# Patient Record
Sex: Male | Born: 1960 | Race: Black or African American | Hispanic: No | Marital: Married | Smoking: Never smoker
Health system: Southern US, Community
[De-identification: ages and names within clinical notes are randomized; demographics above are authoritative.]

## PROBLEM LIST (undated history)

## (undated) DIAGNOSIS — I10 Essential (primary) hypertension: Secondary | ICD-10-CM

## (undated) DIAGNOSIS — C801 Malignant (primary) neoplasm, unspecified: Secondary | ICD-10-CM

## (undated) DIAGNOSIS — K279 Peptic ulcer, site unspecified, unspecified as acute or chronic, without hemorrhage or perforation: Secondary | ICD-10-CM

## (undated) HISTORY — PX: HERNIA REPAIR: SHX51

## (undated) HISTORY — PX: VASECTOMY: SHX75

---

## 2016-10-17 ENCOUNTER — Other Ambulatory Visit (HOSPITAL_COMMUNITY): Payer: Self-pay | Admitting: Internal Medicine

## 2016-10-17 DIAGNOSIS — R748 Abnormal levels of other serum enzymes: Secondary | ICD-10-CM

## 2016-10-21 ENCOUNTER — Ambulatory Visit (HOSPITAL_COMMUNITY)
Admission: RE | Admit: 2016-10-21 | Discharge: 2016-10-21 | Disposition: A | Discharge status: Home / Self Care | Payer: BLUE CROSS/BLUE SHIELD | Source: Ambulatory Visit | Attending: Internal Medicine | Admitting: Internal Medicine

## 2016-10-21 DIAGNOSIS — R748 Abnormal levels of other serum enzymes: Secondary | ICD-10-CM

## 2016-10-21 DIAGNOSIS — N281 Cyst of kidney, acquired: Secondary | ICD-10-CM | POA: Insufficient documentation

## 2016-10-26 ENCOUNTER — Other Ambulatory Visit (HOSPITAL_COMMUNITY): Payer: Self-pay | Admitting: Internal Medicine

## 2016-10-26 DIAGNOSIS — R160 Hepatomegaly, not elsewhere classified: Secondary | ICD-10-CM

## 2016-10-27 HISTORY — PX: PERCUTANEOUS LIVER BIOPSY: SUR136

## 2016-10-28 ENCOUNTER — Other Ambulatory Visit (HOSPITAL_COMMUNITY): Payer: Self-pay | Admitting: Internal Medicine

## 2016-10-28 DIAGNOSIS — R160 Hepatomegaly, not elsewhere classified: Secondary | ICD-10-CM

## 2016-11-02 ENCOUNTER — Ambulatory Visit (HOSPITAL_COMMUNITY)
Admission: RE | Admit: 2016-11-02 | Discharge: 2016-11-02 | Disposition: A | Discharge status: Home / Self Care | Payer: BLUE CROSS/BLUE SHIELD | Source: Ambulatory Visit | Attending: Internal Medicine | Admitting: Internal Medicine

## 2016-11-02 ENCOUNTER — Encounter (HOSPITAL_COMMUNITY): Payer: Self-pay

## 2016-11-02 DIAGNOSIS — R160 Hepatomegaly, not elsewhere classified: Secondary | ICD-10-CM | POA: Insufficient documentation

## 2016-11-02 DIAGNOSIS — C787 Secondary malignant neoplasm of liver and intrahepatic bile duct: Secondary | ICD-10-CM | POA: Insufficient documentation

## 2016-11-02 DIAGNOSIS — K8689 Other specified diseases of pancreas: Secondary | ICD-10-CM | POA: Insufficient documentation

## 2016-11-02 MED ORDER — IOPAMIDOL (ISOVUE-300) INJECTION 61%
INTRAVENOUS | Status: AC
  Administered 2016-11-02: 100 mL via INTRAVENOUS
  Filled 2016-11-02: qty 100

## 2016-11-02 MED ORDER — SODIUM CHLORIDE 0.9 % IJ SOLN
INTRAMUSCULAR | Status: AC
Start: 2016-11-02 — End: 2016-11-03
  Filled 2016-11-02: qty 50

## 2016-11-08 ENCOUNTER — Other Ambulatory Visit: Payer: Self-pay | Admitting: Student

## 2016-11-09 ENCOUNTER — Other Ambulatory Visit: Payer: Self-pay | Admitting: Radiology

## 2016-11-10 ENCOUNTER — Encounter (HOSPITAL_COMMUNITY): Payer: Self-pay

## 2016-11-10 ENCOUNTER — Ambulatory Visit (HOSPITAL_COMMUNITY)
Admission: RE | Admit: 2016-11-10 | Discharge: 2016-11-10 | Disposition: A | Discharge status: Home / Self Care | Payer: BLUE CROSS/BLUE SHIELD | Source: Ambulatory Visit | Attending: Internal Medicine | Admitting: Internal Medicine

## 2016-11-10 DIAGNOSIS — R160 Hepatomegaly, not elsewhere classified: Secondary | ICD-10-CM | POA: Insufficient documentation

## 2016-11-10 DIAGNOSIS — C259 Malignant neoplasm of pancreas, unspecified: Secondary | ICD-10-CM | POA: Insufficient documentation

## 2016-11-10 DIAGNOSIS — C787 Secondary malignant neoplasm of liver and intrahepatic bile duct: Secondary | ICD-10-CM | POA: Insufficient documentation

## 2016-11-10 DIAGNOSIS — I10 Essential (primary) hypertension: Secondary | ICD-10-CM | POA: Diagnosis not present

## 2016-11-10 HISTORY — DX: Essential (primary) hypertension: I10

## 2016-11-10 LAB — CBC
HCT: 32.1 % — ABNORMAL LOW (ref 39.0–52.0)
Hemoglobin: 10.8 g/dL — ABNORMAL LOW (ref 13.0–17.0)
MCH: 25.8 pg — AB (ref 26.0–34.0)
MCHC: 33.6 g/dL (ref 30.0–36.0)
MCV: 76.6 fL — AB (ref 78.0–100.0)
PLATELETS: 484 10*3/uL — AB (ref 150–400)
RBC: 4.19 MIL/uL — ABNORMAL LOW (ref 4.22–5.81)
RDW: 16.8 % — AB (ref 11.5–15.5)
WBC: 12.8 10*3/uL — AB (ref 4.0–10.5)

## 2016-11-10 LAB — COMPREHENSIVE METABOLIC PANEL
ALK PHOS: 974 U/L — AB (ref 38–126)
ALT: 176 U/L — ABNORMAL HIGH (ref 17–63)
ANION GAP: 8 (ref 5–15)
AST: 210 U/L — ABNORMAL HIGH (ref 15–41)
Albumin: 2.7 g/dL — ABNORMAL LOW (ref 3.5–5.0)
BILIRUBIN TOTAL: 8.3 mg/dL — AB (ref 0.3–1.2)
BUN: 12 mg/dL (ref 6–20)
CALCIUM: 9.4 mg/dL (ref 8.9–10.3)
CO2: 25 mmol/L (ref 22–32)
Chloride: 104 mmol/L (ref 101–111)
Creatinine, Ser: 1.34 mg/dL — ABNORMAL HIGH (ref 0.61–1.24)
GFR calc non Af Amer: 59 mL/min — ABNORMAL LOW (ref >60)
Glucose, Bld: 93 mg/dL (ref 65–99)
Potassium: 4.2 mmol/L (ref 3.5–5.1)
Sodium: 137 mmol/L (ref 135–145)
TOTAL PROTEIN: 7.9 g/dL (ref 6.5–8.1)

## 2016-11-10 LAB — PROTIME-INR
INR: 1.24
INR: 1.22
PROTHROMBIN TIME: 15.6 s — AB (ref 11.4–15.2)
Prothrombin Time: 15.5 seconds — ABNORMAL HIGH (ref 11.4–15.2)

## 2016-11-10 LAB — APTT
APTT: 34 s (ref 24–36)
aPTT: 34 seconds (ref 24–36)

## 2016-11-10 MED ORDER — FENTANYL CITRATE (PF) 100 MCG/2ML IJ SOLN
INTRAMUSCULAR | Status: AC
  Filled 2016-11-10: qty 4

## 2016-11-10 MED ORDER — MIDAZOLAM HCL 2 MG/2ML IJ SOLN
INTRAMUSCULAR | Status: AC
  Filled 2016-11-10: qty 6

## 2016-11-10 MED ORDER — MIDAZOLAM HCL 2 MG/2ML IJ SOLN
INTRAMUSCULAR | Status: AC | PRN
  Administered 2016-11-10: 1 mg via INTRAVENOUS
  Administered 2016-11-10: 2 mg via INTRAVENOUS
  Administered 2016-11-10: 1 mg via INTRAVENOUS

## 2016-11-10 MED ORDER — HYDROCODONE-ACETAMINOPHEN 5-325 MG PO TABS
1.0000 | ORAL_TABLET | ORAL | Status: DC | PRN
Start: 2016-11-10 — End: 2016-11-11

## 2016-11-10 MED ORDER — FENTANYL CITRATE (PF) 100 MCG/2ML IJ SOLN
INTRAMUSCULAR | Status: AC | PRN
  Administered 2016-11-10: 25 ug via INTRAVENOUS
  Administered 2016-11-10: 50 ug via INTRAVENOUS
  Administered 2016-11-10: 25 ug via INTRAVENOUS

## 2016-11-10 MED ORDER — SODIUM CHLORIDE 0.9 % IV SOLN
INTRAVENOUS | Status: DC
  Administered 2016-11-10: 11:00:00 via INTRAVENOUS

## 2016-11-10 NOTE — Procedures (Signed)
US liver lesion core biopsy 18g x3 to surg path No complication No blood loss. See complete dictation in [facility].

## 2016-11-10 NOTE — H&P (Signed)
Chief Complaint: hepatic lesions  Referring Physician:Dr. Beatriz Chancellor [facility])  Supervising Physician: Arne Cleveland  Patient Status: [facility] - Out-pt  HPI: Henry Barton is an 56 y.o. male who has otherwise been healthy until this holiday season he noted a 20lb weight loss that was unintentional.  He lives in [place], [place], but is down here in [place] for 2 years with his job at [employer].  He is an Chief Financial Officer.  His PCP ordered some lab work and noted his LFTs to be significantly elevated.  An [place] was obtained that revealed liver masses and then a CT scan was ordered which revealed: 1. 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 2. Hypoenhancing, potentially necrotic pancreatic head mass measuring up to 4.0 cm and extending in exophytic fashion into the peripancreatic fat inferior to the head of pancreas. A second lesion identified near the body tail junction of pancreas. 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 3. Borderline enlarged left para-aortic lymph nodes suspicious for metastatic involvement.  He has been referred for a liver lesion biopsy to confirm the suspected diagnosis.  He has stated that his eyes have become jaundice over the last week.  He denies nausea or vomiting.  He has occasional intermittent abdominal discomfort.  Past Medical History:  Past Medical History:  Diagnosis Date  . Hypertension     Past Surgical History:  Past Surgical History:  Procedure Laterality Date  . HERNIA REPAIR     age 9  . VASECTOMY      Family History: History reviewed. No pertinent family history.  Social History:  reports that he has never smoked. He  does not have any smokeless tobacco history on file. His alcohol and drug histories are not on file.  Allergies: No Known Allergies  Medications: Medications reviewed in epic  Please HPI for pertinent positives, otherwise complete 10 system ROS negative.  Mallampati Score: MD Evaluation Airway: WNL Heart: WNL Abdomen: Other (comments) Abdomen comments: liver enlargement Chest/ Lungs: WNL ASA  Classification: 2 Mallampati/Airway Score: Two  Physical Exam: BP (!) 148/97 (BP Location: Right Arm)   Pulse 98   Temp 98.6 F (37 C) (Oral)   Resp 18   Ht 5\' 10"  (1.778 m)   Wt 172 lb 6.4 oz (78.2 kg) Comment: per patient this am   SpO2 100%   BMI 24.74 kg/m  Body mass index is 24.74 kg/m. General: pleasant, WD, WN black male who is laying in bed in NAD HEENT: head is normocephalic, atraumatic.  Sclera are icteric.  PERRL.  Ears and nose without any masses or lesions.  Mouth is pink and moist Heart: regular, rate, and rhythm.  Normal s1,s2. No obvious murmurs, gallops, or rubs noted.  Palpable radial and pedal pulses bilaterally Lungs: CTAB, no wheezes, rhonchi, or rales noted.  Respiratory effort nonlabored Abd: soft, NT, ND, +BS, no masses, hernias.  +hepatomegaly MS: all 4 extremities are symmetrical with no cyanosis, clubbing, or edema. Psych: A&Ox3 with an appropriate affect.   Labs: Results for orders placed or performed during the hospital encounter of 11/10/16 (from the past 48 hour(s))  APTT upon arrival     Status: None   Collection Time: 11/10/16 11:18 AM  Result Value Ref  Range   aPTT 34 24 - 36 seconds  CBC upon arrival     Status: Abnormal   Collection Time: 11/10/16 11:18 AM  Result Value Ref Range   WBC 12.8 (H) 4.0 - 10.5 K/uL   RBC 4.19 (L) 4.22 - 5.81 MIL/uL   Hemoglobin 10.8 (L) 13.0 - 17.0 g/dL   HCT 32.1 (L) 39.0 - 52.0 %   MCV 76.6 (L) 78.0 - 100.0 fL   MCH 25.8 (L) 26.0 - 34.0 pg   MCHC 33.6 30.0 - 36.0 g/dL   RDW 16.8 (H) 11.5 - 15.5 %    Platelets 484 (H) 150 - 400 K/uL  Protime-INR upon arrival     Status: Abnormal   Collection Time: 11/10/16 11:18 AM  Result Value Ref Range   Prothrombin Time 15.6 (H) 11.4 - 15.2 seconds   INR 1.24     Imaging: No results found.  Assessment/Plan 1. Pancreatic mass with multiple liver lesions -we will plan to proceed today with a liver lesion biopsy -he has also noted his eyes have become increasing jaundice over the last week.  We will check a CMET to verfiy that his TB or any other labs are not dangerously high or concerning. -vitals reviewed -Risks and Benefits discussed with the patient including, but not limited to bleeding, infection, damage to adjacent structures or low yield requiring additional tests. All of the patient's questions were answered, patient is agreeable to proceed. Consent signed and in chart.  Thank you for this interesting consult.  I greatly enjoyed meeting Henry Barton and look forward to participating in their care.  A copy of this report was sent to the requesting provider on this date.  Electronically Signed: Henreitta Cea 11/10/2016, 12:14 PM   I spent a total of  30 Minutes   in face to face in clinical consultation, greater than 50% of which was counseling/coordinating care for liver lesions

## 2016-11-10 NOTE — Discharge Instructions (Signed)
Liver Biopsy °The liver is a large organ in the upper right-hand side of your abdomen. A liver biopsy is a procedure in which a tissue sample is taken from the liver and examined under a microscope. The procedure is done to confirm a suspected problem. °There are three types of liver biopsies: °· Percutaneous. In this type, an incision is made in your abdomen. The sample is removed through the incision with a needle. °· Laparoscopic. In this type, several incisions are made in the abdomen. A tiny camera is passed through one of the incisions to help guide the health care provider. The sample is removed through the other incision or incisions. °· Transjugular. In this type, an incision is made in the neck. A tube is passed through the incision to the liver. The sample is removed through the tube with a needle. °Tell a health care provider about: °· Any allergies you have. °· All medicines you are taking, including vitamins, herbs, eye drops, creams, and over-the-counter medicines. °· Any problems you or family members have had with anesthetic medicines. °· Any blood disorders you have. °· Any surgeries you have had. °· Any medical conditions you have. °· Possibility of pregnancy, if this applies. °What are the risks? °Generally, this is a safe procedure. However, problems can occur and include: °· Bleeding. °· Infection. °· Bruising. °· Collapsed lung. °· Leak of digestive juices (bile) from the liver or gallbladder. °· Problems with heart rhythm. °· Pain at the biopsy site or in the right shoulder. °· Low blood pressure (hypotension). °· Injury to nearby organs or tissues. °What happens before the procedure? °· Your health care provider may do some blood or urine tests. These will help your health care provider learn how well your kidneys and liver are working and how well your blood clots. °· Ask your health care provider if you will be able to go home the day of the procedure. Arrange for someone to take you home  and stay with you for at least 24 hours. °· Do not eat or drink anything after midnight on the night before the procedure or as directed by your health care provider. °· Ask your health care provider about: °¨ Changing or stopping your regular medicines. This is especially important if you are taking diabetes medicines or blood thinners. °¨ Taking medicines such as aspirin and ibuprofen. These medicines can thin your blood. Do not take these medicines before your procedure if your health care provider asks you not to. °What happens during the procedure? °Regardless of the type of biopsy that will be done, you will have an IV line placed. Through this line, you will receive fluids and medicine to relax you. If you will be having a laparoscopic biopsy, you may also receive medicine through this line to make you sleep during the procedure (general anesthetic). °Percutaneous Liver Biopsy °· You will positioned on your back, with your right hand over your head. °· A health care provider will locate your liver by tapping and pressing on the right side of your abdomen or with the help of an ultrasound machine or CT scan. °· An area at the bottom of your last right rib will be numbed. °· An incision will be made in the numbed area. °· The biopsy needle will be inserted into the incision. °· Several samples of liver tissue will be taken with the biopsy needle. You will be asked to hold your breath as each sample is taken. °Laparoscopic Liver Biopsy °·   You will be positioned on your back.  Several small incisions will be made in your abdomen.  Your doctor will pass a tiny camera through one incision. The camera will allow the liver to be viewed on a TV monitor in the operating room.  Tools will be passed through the other incision or incisions. These tools will be used to remove samples of liver tissue. Transjugular Liver Biopsy  You will be positioned on your back on an X-ray table, with your head turned to your  left.  An area on your neck just over your jugular vein will be numbed.  An incision will be made in the numbed area.  A tiny tube will be inserted through the incision. It will be pushed through the jugular vein to a blood vessel in the liver called the hepatic vein.  Dye will be inserted through the tube, and X-rays will be taken. The dye will make the blood vessels in the liver light up on the X-rays.  The biopsy needle will be pushed through the tube until it reaches the liver.  Samples of liver tissue will be taken with the biopsy needle.  The needle and the tube will be removed. After the samples are obtained, the incision or incisions will be closed. What happens after the procedure?  You will be taken to a recovery area.  You may have to lie on your right side for 1-2 hours. This will prevent bleeding from the biopsy site.  Your progress will be watched. Your blood pressure, pulse, and the biopsy site will be checked often.  You may have some pain or feel sick. If this happens, tell your health care provider.  As you begin to feel better, you will be offered ice and beverages.  You may be allowed to go home when the medicines have worn off and you can walk, drink, eat, and use the bathroom. This information is not intended to replace advice given to you by your health care provider. Make sure you discuss any questions you have with your health care provider. Document Released: 12/03/2003 Document Revised: 02/15/2016 Document Reviewed: 11/08/2013 Elsevier Interactive Patient Education  2017 [employer].  Liver Biopsy, Care After Introduction Refer to this sheet in the next few weeks. These instructions provide you with information on caring for yourself after your procedure. Your health care provider may also give you more specific instructions. Your treatment has been planned according to current medical practices, but problems sometimes occur. Call your health care  provider if you have any problems or questions after your procedure. What can I expect after the procedure? After your procedure, it is typical to have the following:  A small amount of discomfort in the area where the biopsy was done and in the right shoulder or shoulder blade.  A small amount of bruising around the area where the biopsy was done and on the skin over the liver.  Sleepiness and fatigue for the rest of the day. Follow these instructions at home:  Rest at home for 1-2 days or as directed by your health care provider.  Have a friend or family member stay with you for at least 24 hours.  Because of the medicines used during the procedure, you should not do the following things in the first 24 hours:  Drive.  Use machinery.  Be responsible for the care of other people.  Sign legal documents.  Take a bath or shower.  There are many different ways to close  and cover an incision, including stitches, skin glue, and adhesive strips. Follow your health care provider's instructions on:  Incision care.  Bandage (dressing) changes and removal.  Incision closure removal.  Do not drink alcohol in the first week.  Do not lift more than 5 pounds or play contact sports for 2 weeks after this test.  Take medicines only as directed by your health care provider. Do not take medicine containing aspirin or non-steroidal anti-inflammatory medicines such as ibuprofen for 1 week after this test.  It is your responsibility to get your test results. Contact a health care provider if:  You have increased bleeding from an incision that results in more than a small spot of blood.  You have redness, swelling, or increasing pain in any incisions.  You notice a discharge or a bad smell coming from any of your incisions.  You have a fever or chills. Get help right away if:  You develop swelling, bloating, or pain in your abdomen.  You become dizzy or faint.  You develop a  rash.  You are nauseous or vomit.  You have difficulty breathing, feel short of breath, or feel faint.  You develop chest pain.  You have problems with your speech or vision.  You have trouble balancing or moving your arms or legs. This information is not intended to replace advice given to you by your health care provider. Make sure you discuss any questions you have with your health care provider. Document Released: 04/01/2005 Document Revised: 02/18/2016 Document Reviewed: 11/08/2013  2017 Elsevier  Moderate Conscious Sedation, Adult, Care After These instructions provide you with information about caring for yourself after your procedure. Your health care provider may also give you more specific instructions. Your treatment has been planned according to current medical practices, but problems sometimes occur. Call your health care provider if you have any problems or questions after your procedure. What can I expect after the procedure? After your procedure, it is common:  To feel sleepy for several hours.  To feel clumsy and have poor balance for several hours.  To have poor judgment for several hours.  To vomit if you eat too soon. Follow these instructions at home: For at least 24 hours after the procedure:   Do not:  Participate in activities where you could fall or become injured.  Drive.  Use heavy machinery.  Drink alcohol.  Take sleeping pills or medicines that cause drowsiness.  Make important decisions or sign legal documents.  Take care of children on your own.  Rest. Eating and drinking  Follow the diet recommended by your health care provider.  If you vomit:  Drink water, juice, or soup when you can drink without vomiting.  Make sure you have little or no nausea before eating solid foods. General instructions  Have a responsible adult stay with you until you are awake and alert.  Take over-the-counter and prescription medicines only as  told by your health care provider.  If you smoke, do not smoke without supervision.  Keep all follow-up visits as told by your health care provider. This is important. Contact a health care provider if:  You keep feeling nauseous or you keep vomiting.  You feel light-headed.  You develop a rash.  You have a fever. Get help right away if:  You have trouble breathing. This information is not intended to replace advice given to you by your health care provider. Make sure you discuss any questions you have with your health  care provider.   Access to my chart Scott:  [email or web address] Bottom right corner of web site allows new users to sign up Document Released: 07/03/2013 Document Revised: 02/15/2016 Document Reviewed: 01/02/2016 Elsevier Interactive Patient Education  2017 [employer].

## 2016-12-27 ENCOUNTER — Inpatient Hospital Stay (HOSPITAL_COMMUNITY)
Admission: EM | Admit: 2016-12-27 | Discharge: 2017-01-15 | DRG: 377 | Disposition: A | Discharge status: Hospice | Payer: BLUE CROSS/BLUE SHIELD | Attending: Internal Medicine | Admitting: Internal Medicine

## 2016-12-27 ENCOUNTER — Encounter (HOSPITAL_COMMUNITY): Payer: Self-pay

## 2016-12-27 DIAGNOSIS — Z452 Encounter for adjustment and management of vascular access device: Secondary | ICD-10-CM

## 2016-12-27 DIAGNOSIS — E162 Hypoglycemia, unspecified: Secondary | ICD-10-CM | POA: Diagnosis present

## 2016-12-27 DIAGNOSIS — E875 Hyperkalemia: Secondary | ICD-10-CM

## 2016-12-27 DIAGNOSIS — D62 Acute posthemorrhagic anemia: Secondary | ICD-10-CM | POA: Diagnosis present

## 2016-12-27 DIAGNOSIS — Z515 Encounter for palliative care: Secondary | ICD-10-CM

## 2016-12-27 DIAGNOSIS — K921 Melena: Secondary | ICD-10-CM | POA: Diagnosis not present

## 2016-12-27 DIAGNOSIS — I120 Hypertensive chronic kidney disease with stage 5 chronic kidney disease or end stage renal disease: Secondary | ICD-10-CM | POA: Diagnosis present

## 2016-12-27 DIAGNOSIS — K922 Gastrointestinal hemorrhage, unspecified: Secondary | ICD-10-CM | POA: Diagnosis present

## 2016-12-27 DIAGNOSIS — Y841 Kidney dialysis as the cause of abnormal reaction of the patient, or of later complication, without mention of misadventure at the time of the procedure: Secondary | ICD-10-CM | POA: Diagnosis not present

## 2016-12-27 DIAGNOSIS — R188 Other ascites: Secondary | ICD-10-CM

## 2016-12-27 DIAGNOSIS — K274 Chronic or unspecified peptic ulcer, site unspecified, with hemorrhage: Secondary | ICD-10-CM

## 2016-12-27 DIAGNOSIS — Z66 Do not resuscitate: Secondary | ICD-10-CM

## 2016-12-27 DIAGNOSIS — T82838A Hemorrhage of vascular prosthetic devices, implants and grafts, initial encounter: Secondary | ICD-10-CM | POA: Diagnosis not present

## 2016-12-27 DIAGNOSIS — M7989 Other specified soft tissue disorders: Secondary | ICD-10-CM

## 2016-12-27 DIAGNOSIS — E872 Acidosis: Secondary | ICD-10-CM

## 2016-12-27 DIAGNOSIS — Z6829 Body mass index (BMI) 29.0-29.9, adult: Secondary | ICD-10-CM

## 2016-12-27 DIAGNOSIS — C259 Malignant neoplasm of pancreas, unspecified: Secondary | ICD-10-CM

## 2016-12-27 DIAGNOSIS — E861 Hypovolemia: Secondary | ICD-10-CM | POA: Diagnosis present

## 2016-12-27 DIAGNOSIS — N170 Acute kidney failure with tubular necrosis: Secondary | ICD-10-CM | POA: Diagnosis present

## 2016-12-27 DIAGNOSIS — N186 End stage renal disease: Secondary | ICD-10-CM

## 2016-12-27 DIAGNOSIS — R066 Hiccough: Secondary | ICD-10-CM | POA: Diagnosis not present

## 2016-12-27 DIAGNOSIS — G893 Neoplasm related pain (acute) (chronic): Secondary | ICD-10-CM | POA: Diagnosis present

## 2016-12-27 DIAGNOSIS — R578 Other shock: Secondary | ICD-10-CM | POA: Diagnosis present

## 2016-12-27 DIAGNOSIS — J9811 Atelectasis: Secondary | ICD-10-CM | POA: Diagnosis present

## 2016-12-27 DIAGNOSIS — Z7189 Other specified counseling: Secondary | ICD-10-CM

## 2016-12-27 DIAGNOSIS — R509 Fever, unspecified: Secondary | ICD-10-CM

## 2016-12-27 DIAGNOSIS — N179 Acute kidney failure, unspecified: Secondary | ICD-10-CM

## 2016-12-27 DIAGNOSIS — K279 Peptic ulcer, site unspecified, unspecified as acute or chronic, without hemorrhage or perforation: Secondary | ICD-10-CM | POA: Diagnosis present

## 2016-12-27 DIAGNOSIS — E43 Unspecified severe protein-calorie malnutrition: Secondary | ICD-10-CM

## 2016-12-27 DIAGNOSIS — D689 Coagulation defect, unspecified: Secondary | ICD-10-CM | POA: Diagnosis present

## 2016-12-27 DIAGNOSIS — K920 Hematemesis: Secondary | ICD-10-CM | POA: Diagnosis present

## 2016-12-27 DIAGNOSIS — E8770 Fluid overload, unspecified: Secondary | ICD-10-CM | POA: Diagnosis present

## 2016-12-27 DIAGNOSIS — F329 Major depressive disorder, single episode, unspecified: Secondary | ICD-10-CM | POA: Diagnosis present

## 2016-12-27 DIAGNOSIS — C787 Secondary malignant neoplasm of liver and intrahepatic bile duct: Secondary | ICD-10-CM | POA: Diagnosis present

## 2016-12-27 DIAGNOSIS — D72829 Elevated white blood cell count, unspecified: Secondary | ICD-10-CM | POA: Diagnosis present

## 2016-12-27 DIAGNOSIS — K7290 Hepatic failure, unspecified without coma: Secondary | ICD-10-CM | POA: Diagnosis present

## 2016-12-27 DIAGNOSIS — D696 Thrombocytopenia, unspecified: Secondary | ICD-10-CM | POA: Diagnosis not present

## 2016-12-27 HISTORY — DX: Peptic ulcer, site unspecified, unspecified as acute or chronic, without hemorrhage or perforation: K27.9

## 2016-12-27 HISTORY — DX: Malignant (primary) neoplasm, unspecified: C80.1

## 2016-12-27 LAB — CBC
HCT: 12 % — ABNORMAL LOW (ref 39.0–52.0)
Hemoglobin: 4.3 g/dL — CL (ref 13.0–17.0)
MCH: 29.9 pg (ref 26.0–34.0)
MCHC: 35.8 g/dL (ref 30.0–36.0)
MCV: 83.3 fL (ref 78.0–100.0)
PLATELETS: 280 10*3/uL (ref 150–400)
RBC: 1.44 MIL/uL — AB (ref 4.22–5.81)
RDW: 19.1 % — ABNORMAL HIGH (ref 11.5–15.5)
WBC: 26.5 10*3/uL — AB (ref 4.0–10.5)

## 2016-12-27 LAB — BASIC METABOLIC PANEL
ANION GAP: 26 — AB (ref 5–15)
ANION GAP: 23 — AB (ref 5–15)
BUN: 173 mg/dL — ABNORMAL HIGH (ref 6–20)
BUN: 175 mg/dL — ABNORMAL HIGH (ref 6–20)
CALCIUM: 7.8 mg/dL — AB (ref 8.9–10.3)
CHLORIDE: 95 mmol/L — AB (ref 101–111)
CO2: 8 mmol/L — ABNORMAL LOW (ref 22–32)
CO2: 10 mmol/L — ABNORMAL LOW (ref 22–32)
CREATININE: 10.75 mg/dL — AB (ref 0.61–1.24)
Calcium: 7.7 mg/dL — ABNORMAL LOW (ref 8.9–10.3)
Chloride: 92 mmol/L — ABNORMAL LOW (ref 101–111)
Creatinine, Ser: 10.69 mg/dL — ABNORMAL HIGH (ref 0.61–1.24)
GFR calc non Af Amer: 5 mL/min — ABNORMAL LOW (ref >60)
GFR, EST AFRICAN AMERICAN: 5 mL/min — AB (ref >60)
GFR, EST AFRICAN AMERICAN: 5 mL/min — AB (ref >60)
GFR, EST NON AFRICAN AMERICAN: 5 mL/min — AB (ref >60)
Glucose, Bld: 87 mg/dL (ref 65–99)
Glucose, Bld: 93 mg/dL (ref 65–99)
POTASSIUM: 7.2 mmol/L — AB (ref 3.5–5.1)
Potassium: >7.5 mmol/L (ref 3.5–5.1)
SODIUM: 126 mmol/L — AB (ref 135–145)
SODIUM: 128 mmol/L — AB (ref 135–145)

## 2016-12-27 LAB — HEPATIC FUNCTION PANEL
ALT: 131 U/L — AB (ref 17–63)
AST: 302 U/L — AB (ref 15–41)
Albumin: 1.3 g/dL — ABNORMAL LOW (ref 3.5–5.0)
Alkaline Phosphatase: 1285 U/L — ABNORMAL HIGH (ref 38–126)
BILIRUBIN DIRECT: 10 mg/dL — AB (ref 0.1–0.5)
Indirect Bilirubin: 6.9 mg/dL — ABNORMAL HIGH (ref 0.3–0.9)
Total Bilirubin: 16.9 mg/dL — ABNORMAL HIGH (ref 0.3–1.2)
Total Protein: 5.6 g/dL — ABNORMAL LOW (ref 6.5–8.1)

## 2016-12-27 LAB — POC OCCULT BLOOD, ED: Fecal Occult Bld: POSITIVE — AB

## 2016-12-27 LAB — PREPARE RBC (CROSSMATCH)

## 2016-12-27 LAB — PROTIME-INR
INR: 1.96
Prothrombin Time: 22.6 seconds — ABNORMAL HIGH (ref 11.4–15.2)

## 2016-12-27 LAB — ABO/RH: ABO/RH(D): A POS

## 2016-12-27 LAB — CBG MONITORING, ED: GLUCOSE-CAPILLARY: 84 mg/dL (ref 65–99)

## 2016-12-27 MED ORDER — PANTOPRAZOLE SODIUM 40 MG IV SOLR
80.0000 mg | Freq: Two times a day (BID) | INTRAVENOUS | Status: DC
  Administered 2016-12-27: 80 mg via INTRAVENOUS
  Filled 2016-12-27: qty 80

## 2016-12-27 MED ORDER — SODIUM CHLORIDE 0.9 % IV SOLN
10.0000 mL/h | Freq: Once | INTRAVENOUS | Status: AC
  Administered 2016-12-27: 10 mL/h via INTRAVENOUS

## 2016-12-27 MED ORDER — SODIUM BICARBONATE 8.4 % IV SOLN
INTRAVENOUS | Status: DC
  Administered 2016-12-27 – 2016-12-30 (×5): via INTRAVENOUS
  Filled 2016-12-27 (×14): qty 150

## 2016-12-27 MED ORDER — SODIUM BICARBONATE 8.4 % IV SOLN
50.0000 meq | Freq: Once | INTRAVENOUS | Status: AC
  Administered 2016-12-27: 50 meq via INTRAVENOUS
  Filled 2016-12-27: qty 50

## 2016-12-27 MED ORDER — SODIUM CHLORIDE 0.9 % IV SOLN
1.0000 g | Freq: Once | INTRAVENOUS | Status: AC
  Administered 2016-12-27: 1 g via INTRAVENOUS
  Filled 2016-12-27: qty 10

## 2016-12-27 MED ORDER — SODIUM CHLORIDE 0.9 % IV BOLUS (SEPSIS)
1000.0000 mL | Freq: Once | INTRAVENOUS | Status: AC
Start: 2016-12-27 — End: 2016-12-27
  Administered 2016-12-27: 1000 mL via INTRAVENOUS

## 2016-12-27 MED ORDER — SODIUM POLYSTYRENE SULFONATE 15 GM/60ML PO SUSP
30.0000 g | Freq: Once | ORAL | Status: AC
  Administered 2016-12-28: 30 g via RECTAL
  Filled 2016-12-27: qty 120

## 2016-12-27 MED ORDER — INSULIN ASPART 100 UNIT/ML IV SOLN
10.0000 [IU] | Freq: Once | INTRAVENOUS | Status: AC
  Administered 2016-12-27: 10 [IU] via INTRAVENOUS
  Filled 2016-12-27: qty 0.1

## 2016-12-27 MED ORDER — DEXTROSE 50 % IV SOLN
1.0000 | Freq: Once | INTRAVENOUS | Status: AC
  Administered 2016-12-27: 50 mL via INTRAVENOUS
  Filled 2016-12-27: qty 50

## 2016-12-27 NOTE — ED Provider Notes (Addendum)
[facility] DEPT Provider Note   CSN: [id number] Arrival date & time: 12/27/16  1836     History   Chief Complaint Chief Complaint  Patient presents with  . Hypotension    Chemo pt  . GI Bleeding    HPI Henry Barton is a 56 y.o. male.Patient began to have black stools this morning. He denies pain anywhere he complains of lightheadedness worse with standing and had syncopal event when standing earlier today. No treatment prior to coming here. Lightheadedness worse with standing and improved with lying down. No other associated symptoms.  HPI  Past Medical History:  Diagnosis Date  . Cancer [facility])    pancreatic, liver  . Hypertension    Currently getting chemotherapy at [facility] for pancreatic cancer. There are no active problems to display for this patient.   Past Surgical History:  Procedure Laterality Date  . HERNIA REPAIR     age 65  . VASECTOMY         Home Medications    Prior to Admission medications   Medication Sig Start Date End Date Taking? Authorizing Provider  capecitabine (XELODA) 500 MG tablet Take 1,000 mg by mouth 2 (two) times daily. TAKE 1000MG  BY MOUTH IN AM AND 1000MG  BY MOUTH IN PM. TAKE WITH WATER AFTER MEALS DAYS 1-14 AND STOP ON DAYS 15-21 11/24/16  Yes Historical Provider, MD  CREON 24000-76000 units CPEP Take 2 capsules by mouth 4 (four) times daily. 11/29/16  Yes Historical Provider, MD  dexamethasone (DECADRON) 4 MG tablet Take 8 mg by mouth as directed. Take 8mg  by mouth every morning x 2 days after the first day of each cycle then UTD 11/24/16  Yes Historical Provider, MD  gabapentin (NEURONTIN) 100 MG capsule Take 100 mg by mouth 3 (three) times daily. Hiccups 12/22/16  Yes Historical Provider, MD  lidocaine-prilocaine (EMLA) cream Apply 1 application topically as directed. 11/29/16  Yes Historical Provider, MD  Multiple Vitamin (MULTIVITAMIN) capsule Take 1 capsule by mouth daily.   Yes Historical Provider, MD  naproxen (NAPROSYN) 500 MG tablet  Take 500 mg by mouth 2 (two) times daily with a meal.   Yes Historical Provider, MD  ondansetron (ZOFRAN) 8 MG tablet Take 8 mg by mouth every 12 (twelve) hours as needed for nausea/vomiting. 11/24/16  Yes Historical Provider, MD    Family History History reviewed. No pertinent family history.  Social History Social History  Substance Use Topics  . Smoking status: Never Smoker  . Smokeless tobacco: Never Used  . Alcohol use Not on file     Allergies   Patient has no known allergies.   Review of Systems Review of Systems  Constitutional: Negative.   HENT: Negative.   Respiratory: Negative.   Cardiovascular: Negative.   Gastrointestinal: Positive for abdominal distention.       BLack stools, jaundiced since there were 2018  Musculoskeletal: Negative.   Skin: Negative.   Allergic/Immunologic: Positive for immunocompromised state.       Cancer patient  Neurological: Negative.   Psychiatric/Behavioral: Negative.      Physical Exam Updated Vital Signs BP (!) 116/55   Pulse 79   Temp 97.5 F (36.4 C) (Oral)   Resp 16   Wt 174 lb (78.9 kg)   SpO2 100%   BMI 24.97 kg/m   Physical Exam  Constitutional:  Chronically ill-appearing  HENT:  Head: Normocephalic and atraumatic.  Eyes: Conjunctivae are normal. Pupils are equal, round, and reactive to light. Scleral icterus is present.  Conjunctiva pale  Neck: Neck supple. No tracheal deviation present. No thyromegaly present.  Cardiovascular: Normal rate and regular rhythm.   No murmur heard. Pulmonary/Chest: Effort normal and breath sounds normal.  Abdominal: Soft. Bowel sounds are normal. He exhibits no distension. There is no tenderness.  Genitourinary: Penis normal. Rectal exam shows guaiac positive stool.  Genitourinary Comments: Rectal normal tone black melanotic stool heme positive  Musculoskeletal: Normal range of motion. He exhibits no edema or tenderness.  Neurological: He is alert. Coordination normal.  Skin:  Skin is warm and dry. No rash noted.  Psychiatric: He has a normal mood and affect.  Nursing note and vitals reviewed.    ED Treatments / Results  Labs (all labs ordered are listed, but only abnormal results are displayed) Labs Reviewed  BASIC METABOLIC PANEL - Abnormal; Notable for the following:       Result Value   Sodium 126 (*)    Potassium >7.5 (*)    Chloride 92 (*)    CO2 8 (*)    Creatinine, Ser 10.69 (*)    Calcium 7.8 (*)    GFR calc non Af Amer 5 (*)    GFR calc Af Amer 5 (*)    Anion gap 26 (*)    All other components within normal limits  CBC - Abnormal; Notable for the following:    WBC 26.5 (*)    RBC 1.44 (*)    Hemoglobin 4.3 (*)    HCT 12.0 (*)    RDW 19.1 (*)    All other components within normal limits  PROTIME-INR - Abnormal; Notable for the following:    Prothrombin Time 22.6 (*)    All other components within normal limits  HEPATIC FUNCTION PANEL - Abnormal; Notable for the following:    Total Protein 5.6 (*)    Albumin 1.3 (*)    ALT 131 (*)    Alkaline Phosphatase 1,285 (*)    Total Bilirubin 16.9 (*)    Bilirubin, Direct 10.0 (*)    Indirect Bilirubin 6.9 (*)    All other components within normal limits  POC OCCULT BLOOD, ED - Abnormal; Notable for the following:    Fecal Occult Bld POSITIVE (*)    All other components within normal limits  URINALYSIS, ROUTINE W REFLEX MICROSCOPIC  CBG MONITORING, ED  TYPE AND SCREEN  PREPARE RBC (CROSSMATCH)  ABO/RH    EKG  EKG Interpretation  Date/Time:  Tuesday December 27 2016 19:43:35 EDT Ventricular Rate:  78 PR Interval:    QRS Duration: 140 QT Interval:  443 QTC Calculation: 505 R Axis:   74 Text Interpretation:  Sinus rhythm Nonspecific intraventricular conduction delay No old tracing to compare Confirmed by Winfred Leeds  MD, Sebastian Lurz [phone]) on 12/27/2016 8:41:04 PM      Results for orders placed or performed during the hospital encounter of 00/86/76  Basic metabolic panel  Result Value Ref  Range   Sodium 126 (L) 135 - 145 mmol/L   Potassium >7.5 (HH) 3.5 - 5.1 mmol/L   Chloride 92 (L) 101 - 111 mmol/L   CO2 8 (L) 22 - 32 mmol/L   Glucose, Bld 87 65 - 99 mg/dL   BUN 173 (H) 6 - 20 mg/dL   Creatinine, Ser 10.69 (H) 0.61 - 1.24 mg/dL   Calcium 7.8 (L) 8.9 - 10.3 mg/dL   GFR calc non Af Amer 5 (L) >60 mL/min   GFR calc Af Amer 5 (L) >60 mL/min   Anion gap 26 (  H) 5 - 15  CBC  Result Value Ref Range   WBC 26.5 (H) 4.0 - 10.5 K/uL   RBC 1.44 (L) 4.22 - 5.81 MIL/uL   Hemoglobin 4.3 (LL) 13.0 - 17.0 g/dL   HCT 12.0 (L) 39.0 - 52.0 %   MCV 83.3 78.0 - 100.0 fL   MCH 29.9 26.0 - 34.0 pg   MCHC 35.8 30.0 - 36.0 g/dL   RDW 19.1 (H) 11.5 - 15.5 %   Platelets 280 150 - 400 K/uL  Protime-INR  Result Value Ref Range   Prothrombin Time 22.6 (H) 11.4 - 15.2 seconds   INR 1.96   Hepatic function panel  Result Value Ref Range   Total Protein 5.6 (L) 6.5 - 8.1 g/dL   Albumin 1.3 (L) 3.5 - 5.0 g/dL   AST 302 (H) 15 - 41 U/L   ALT 131 (H) 17 - 63 U/L   Alkaline Phosphatase 1,285 (H) 38 - 126 U/L   Total Bilirubin 16.9 (H) 0.3 - 1.2 mg/dL   Bilirubin, Direct 10.0 (H) 0.1 - 0.5 mg/dL   Indirect Bilirubin 6.9 (H) 0.3 - 0.9 mg/dL  CBG monitoring, ED  Result Value Ref Range   Glucose-Capillary 84 65 - 99 mg/dL  POC occult blood, ED  Result Value Ref Range   Fecal Occult Bld POSITIVE (A) NEGATIVE  Type and screen [place]  Result Value Ref Range   ABO/RH(D) A POS    Antibody Screen NEG    Sample Expiration 12/30/2016    Unit Number [id number]    Blood Component Type RED CELLS,LR    Unit division 00    Status of Unit ISSUED    Transfusion Status OK TO TRANSFUSE    Crossmatch Result Compatible    Unit Number [id number]    Blood Component Type RED CELLS,LR    Unit division 00    Status of Unit ALLOCATED    Transfusion Status OK TO TRANSFUSE    Crossmatch Result Compatible   Prepare RBC  Result Value Ref Range   Order Confirmation ORDER PROCESSED  BY BLOOD BANK   ABO/Rh  Result Value Ref Range   ABO/RH(D) A POS   BPAM RBC  Result Value Ref Range   ISSUE DATE / TIME [id number]    Blood Product Unit Number [id number]    PRODUCT CODE E0336V00    Unit Type and Rh 2774    Blood Product Expiration Date 128786767209    Blood Product Unit Number [id number]    Unit Type and Rh 7654    Blood Product Expiration Date [id number]    No results found. Radiology No results found.  Procedures Procedures (including critical care time)  Medications Ordered in ED Medications  0.9 %  sodium chloride infusion (not administered)  calcium gluconate 1 g in sodium chloride 0.9 % 100 mL IVPB (not administered)  insulin aspart (novoLOG) injection 10 Units (not administered)  dextrose 50 % solution 50 mL (not administered)  sodium chloride 0.9 % bolus 1,000 mL (1,000 mLs Intravenous New Bag/Given 12/27/16 1946)     Initial Impression / Assessment and Plan / ED Course  I have reviewed the triage vital signs and the nursing notes.  Pertinent labs & imaging results that were available during my care of the patient were reviewed by me and considered in my medical decision making (see chart for details).   emergent transfusion with packed red cells ordered due to critically low hemoglobin and acute GI bleed with melanotic  stool. Patient markedly hyperkalemic with acute renal failure. Treated with D50. Insulin. Bicarbonate. Calcium gluconate. I've consulted nephrology service Dr. Mercy Moore and he feels the patient does not require emergent dialysis can be treated medically. He suggests adding bicarbonate intravenous drip with 3 amps sodium bicarbonate in 1 L of D5W to run at 100 mL per hour.  Ordered by me. I've also consulted GI service Dr. Oletta Lamas who suggest protonix 80 mg every 12 hours intravenously. Ordered by me. Also consulted critical care service Dr. Oletta Darter who will arrange for patient to be admitted to intensive care unit.   Dr.  Mercy Moore feels the patient does not require transfer to [place] and does not require emergent hemodialysis.  10:30 PM patient is resting comfortably. Alert awake Glasgow coma score 15 remains hemodynamically stable  Final Clinical Impressions(s) / ED Diagnoses   Final diagnoses:  None  Diagnosis #1 acute upper GI bleeding with melena  #2 symptomatic anemia #3 acute renal failure Number for hyperkalemia #5hyponatremia CRITICAL CARE Performed by: Orlie Dakin Total critical care time: 90 minutes Critical care time was exclusive of separately billable procedures and treating other patients. Critical care was necessary to treat or prevent imminent or life-threatening deterioration. Critical care was time spent personally by me on the following activities: development of treatment plan with patient and/or surrogate as well as nursing, discussions with consultants, evaluation of patient's response to treatment, examination of patient, obtaining history from patient or surrogate, ordering and performing treatments and interventions, ordering and review of laboratory studies, ordering and review of radiographic studies, pulse oximetry and re-evaluation of patient's condition. New Prescriptions New Prescriptions   No medications on file     Orlie Dakin, MD 12/27/16 [place], MD 12/27/16 2259

## 2016-12-27 NOTE — H&P (Signed)
PULMONARY / CRITICAL CARE MEDICINE   Name: Henry BROOKENS MRN: [id number] DOB: October 16, 1960    ADMISSION DATE:  12/27/2016 CONSULTATION DATE:  12/27/2016  REFERRING MD:  Orlie Dakin, M.D. / EDP  CHIEF COMPLAINT:  Weakness  HISTORY OF PRESENT ILLNESS: Henry Barton is a 56 y.o. male with PMH as outlined below including pancreatic cancer with mets to liver, currently undergoing chemotherapy under the care Dr. Berneice Gandy at [facility] (diagnosed 11/10/16 by liver biopsy, started chemo 11/29/16). He presented to [facility] ED on 4/3 with weakness and hypotension. Symptoms began around noon that afternoon. He had also been having dark bloody stools that started 1 day prior along with intermittent lightheadedness.  While in ED, he stood up and had syncopal episode. Labs returned notable for multiple significant metabolic derangements including Na 126, K > 7.5 (s/p calcium, insulin, D50, HCO3), anion gap 26, SCr 10.69 (1.34 just 2 months prior), alkaline phosphatase 1285, AST 302, ALT 131, WBC 26.5, Hgb 4.3, FOBT Positive.  Due to his multiple metabolic derangements including significant worsening renal function, PCCM was asked to admit.  Patient confirms that he had a fever on Sunday and did take ibuprofen for said fever. He's had no further chills, fever, or sweats. The patient does report he has had a worsening in his abdominal pain predominantly on his right side that he rates now 7 out of 10. Pain is relieved somewhat with his Naprosyn which she is taking daily at night. He denies any other NSAID use. Patient reports he was transfused last Tuesday for hemoglobin of 6.3. He received 3 units of packed red blood cells. He has had previous transfusions for anemia as well. He reports he has had no prior history of GI bleeding although he does report a remote history of peptic ulcers. Patient previously was on Decadron but has not been on this or lisinopril recently. Patient reports his last chemotherapy treatment was Tuesday  and he was started on Xeloda afterward. He does take over-the-counter iron to help with his anemia as well. The patient reports he has had 3 days of lack of energy and then approximately 1 day ago began to notice dark red blood in his stool. He reports his bowel movements became more frequent and began occurring every hour. He denies any melena. Of note the patient does endorse coffee ground emesis. He denies any chest pain, pressure, or pleurisy. He does endorse dyspnea on exertion. He reports good urine output. Patient reports that consuming small meals or liquids does help to relieve his abdominal discomfort. Patient also has noted increasing pedal edema. In the emergency department he was given IV insulin IV dextrose and started on a bicarbonate drip. Emergency department provider did discuss the case with the nephrologist and gastroenterologist on call. GI recommended Protonix IV every 12 hours and nephrology recommended fluids, bicarbonate drip, and medical treatment while monitoring for signs of renal recovery.  PAST MEDICAL HISTORY :  Past Medical History:  Diagnosis Date  . Cancer [facility])    pancreatic, liver  . Hypertension   . Peptic ulcer disease    Nonbleeding & remote   PAST SURGICAL HISTORY: Past Surgical History:  Procedure Laterality Date  . HERNIA REPAIR     age 44  . PERCUTANEOUS LIVER BIOPSY  10/2016  . VASECTOMY      No Known Allergies  No current facility-administered medications on file prior to encounter.    No current outpatient prescriptions on file prior to encounter.    FAMILY  HISTORY:  family history includes Lung cancer in his maternal grandmother.  SOCIAL HISTORY: Social History   Social History  . Marital status: Married    Spouse name: N/A  . Number of children: N/A  . Years of education: N/A   Social History Main Topics  . Smoking status: Never Smoker  . Smokeless tobacco: Never Used  . Alcohol use No  . Drug use: Unknown  . Sexual activity:  Not Asked   Other Topics Concern  . None   Social History Narrative   [facility] (12/27/16):   Lives with his wife and has been married for 32 years. This signifies wife is his primary decision maker with his daughter as a Naval architect. Patient has no living will or legal healthcare power of attorney signified.   REVIEW OF SYSTEMS: No rashes or abnormal bruising. No headache or focal weakness, numbness, or tingling. A pertinent 14 point review of systems is negative except as per the history of presenting illness.  SUBJECTIVE: As above.  VITAL SIGNS: BP 121/68 (BP Location: Left Arm)   Pulse 84   Temp 97.8 F (36.6 C) (Oral)   Resp 19   Wt 174 lb (78.9 kg)   SpO2 100%   BMI 24.97 kg/m   HEMODYNAMICS:    VENTILATOR SETTINGS:    INTAKE / OUTPUT: No intake/output data recorded.   PHYSICAL EXAMINATION: General:  Awake. Alert. No acute distress. Wife at bedside.  Integument:  Warm & dry. No rash on exposed skin. No bruising. Jaundice noted. Extremities:  No cyanosis or clubbing.  Lymphatics:  No appreciated cervical or supraclavicular lymphadenoapthy. HEENT:  Moist mucus membranes. No oral ulcers. No scleral injection but there is scleral icterus. PERRL. Cardiovascular:  Regular rate. Pitting lower extremity edema. No appreciable JVD.  Pulmonary:  Good aeration & clear to auscultation bilaterally. Symmetric chest wall expansion. No accessory muscle use on room air. Abdomen: Soft. Normal bowel sounds. Protuberant. Tender to palpation over right side. No guarding. Musculoskeletal:  Normal bulk and tone. Hand grip strength 5/5 bilaterally. No joint deformity or effusion appreciated. Neurological:  CN 2-12 grossly in tact. No meningismus. Moving all 4 extremities equally. Symmetric brachioradialis deep tendon reflexes. Psychiatric:  Mood and affect congruent. Speech normal rhythm, rate & tone.   LABS:  BMET  Recent Labs Lab 12/27/16 1927 12/27/16 2237   NA 126* 128*  K >7.5* 7.2*  CL 92* 95*  CO2 8* 10*  BUN 173* 175*  CREATININE 10.69* 10.75*  GLUCOSE 87 93    Electrolytes  Recent Labs Lab 12/27/16 1927 12/27/16 2237  CALCIUM 7.8* 7.7*    CBC  Recent Labs Lab 12/27/16 1927  WBC 26.5*  HGB 4.3*  HCT 12.0*  PLT 280    Coag's  Recent Labs Lab 12/27/16 1927  INR 1.96    Sepsis Markers No results for input(s): LATICACIDVEN, PROCALCITON, O2SATVEN in the last 168 hours.  ABG No results for input(s): PHART, PCO2ART, PO2ART in the last 168 hours.  Liver Enzymes  Recent Labs Lab 12/27/16 1927  AST 302*  ALT 131*  ALKPHOS 1,285*  BILITOT 16.9*  ALBUMIN 1.3*    Cardiac Enzymes No results for input(s): TROPONINI, PROBNP in the last 168 hours.  Glucose  Recent Labs Lab 12/27/16 1935  GLUCAP 84    Imaging No results found.   STUDIES:  LIVER BIOPSY 11/10/16:  Metastatic adenocarcinoma consistent with pancreatic primary. EKG 4/3:  Personally reviewed by me. Borderline QRS complex widening. Some peaking of  the T waves. QTc prolonged.  MICROBIOLOGY: None.  ANTIBIOTICS: None.  SIGNIFICANT EVENTS: 04/03 - Admit with GI Bleeding & Hyperkalemia  LINES/TUBES: PIV x2  DISCUSSION: 56 y.o. male with hx pancreatic cancer with metastases to the liver (currently undergoing chemotherapy and the care of Dr. Berneice Gandy at [facility]).  Admitted 4/3 with acute blood loss anemia (Hgb 4.3), acute on chronic kidney disease, multiple metabolic derangements.Lengthy discussion with the patient regarding his goals of care and he would be willing to undergo hemodialysis. We did discuss transferring to [facility] but both he and I feel that medical stabilization is necessary first. Patient affirms that he would want his wife to be his primary medical decision maker in the event he is incapacitated. He also signifies that he would wish to undergo every aspect resuscitation other than CPR because he would "not want his ribs  broken". As her no ICU beds available at this hospital we were transfer him to [facility] for further care. I have spoken with our medical transport team as well as the management to arrange for transport to our medical ICU.  ASSESSMENT / PLAN:  RENAL A:   Acute on Chronic Renal Failure - Likely due to hypovolemia. AGMA - Multifactorial from azotemia. Hyperkalemia - Likely due to acute renal failure. Pseudohypocalcemia - Corrects. Likely due to hypoalbuminemia.  P:   Nephrology consulted by EDP & reportedly will see in AM Patient willing to undergo hemodialysis if needed Continuing Bicarb drip Trending electrolytes q4hr Monitoring patient on telemetry Monitoring Urine Output Checking & trending Lactic Acid  Checking bilateral renal ultrasound  GASTROINTESTINAL A:  Acute Upper GI Bleeding - Likely due to peptic ulcer in setting of NSAID use daily. Transaminitis - Worsening. Known pancreatic cancer with liver mets. Moderate Protein-Calorie Malnutrition  P:   NPO Protonix drip GI already consulted by EDP & will see in the morning Trending hepatic function panel Checking Port CXR & Port Abdominal X-ray for possible perforation  HEMATOLOGIC / ONCOLOGIC A:   Acute Blood Loss Anemia - Likely due to upper GI bleed. Coagulopathy - Multifactorial. Pancreatic Cancer with Liver Mets - Follows with Dr. Berneice Gandy at [facility].   P:  Transfusing a total of 3u PRBC Trending Hgb/Hct q4hr Plan to transfuse for Hgb <7.0 or active bleeding Vitamin K 5mg  IV x1 now Repeat Coags in AM Checking Fibrinogen now SCDs  PULMONARY A: No acute issues.  P:   Monitoring continuous pulse oximetry.   CARDIOVASCULAR A:  No acute issues.  P:  Monitoring patient on telemetry Repeat EKG in AM given hyperkalemia Trending Troponin I q6hr Checking TTE given lower extremity edema  INFECTIOUS A:   Leukocytosis - Suspect demargination & increased bone marrow production with blood loss.  P:    Trending cell counts Monitoring for signs of infection.   ENDOCRINE A:   Possible Hypoglycemia - Received IV insulin in setting of acute renal failure.    P:   Monitoring glucose with accu-checks q4hr with MD notification parameters.  NEUROLOGIC A:   No acute issues.  P:   Monitor closely in ICU.   Family updated: Wife updated extensively at bedside in the presence of the patient by me.  Interdisciplinary Family Meeting v Palliative Care Meeting:  Due by: 4/11  I have spent a total of 55 minutes of critical care time today caring for the patient, updating the patient and his wife at bedside, and reviewing the patient's electronic medical record.   Sonia Baller Ashok Cordia, M.D. [facility] Pulmonary &  Critical Care Pager:  [phone] After 3pm or if no response, call [phone] 12:40 AM 12/28/16

## 2016-12-27 NOTE — ED Notes (Signed)
Patient's wife inquiring about patient's chemo med due at 2100. Dr. Winfred Leeds made aware and states to hold oral chemo med at this time due to severe anemia.

## 2016-12-27 NOTE — ED Triage Notes (Signed)
Pt BIB family c/o weakness and hyPOtension starting around noon today. He also verbalized that he had been having dark, bloody stools starting yesterday. He is getting chemo for liver and pancreatic cancer. Last tx was last Tuesday. Pt passed out in lobby. A&Ox4 at this time.

## 2016-12-27 NOTE — ED Notes (Signed)
Intensivist at bedside.

## 2016-12-28 ENCOUNTER — Encounter (HOSPITAL_COMMUNITY): Payer: Self-pay | Admitting: Pulmonary Disease

## 2016-12-28 ENCOUNTER — Inpatient Hospital Stay (HOSPITAL_COMMUNITY): Payer: BLUE CROSS/BLUE SHIELD

## 2016-12-28 DIAGNOSIS — M7989 Other specified soft tissue disorders: Secondary | ICD-10-CM | POA: Diagnosis not present

## 2016-12-28 DIAGNOSIS — N189 Chronic kidney disease, unspecified: Secondary | ICD-10-CM | POA: Diagnosis not present

## 2016-12-28 DIAGNOSIS — K922 Gastrointestinal hemorrhage, unspecified: Secondary | ICD-10-CM | POA: Diagnosis present

## 2016-12-28 DIAGNOSIS — E8770 Fluid overload, unspecified: Secondary | ICD-10-CM | POA: Diagnosis present

## 2016-12-28 DIAGNOSIS — R7989 Other specified abnormal findings of blood chemistry: Secondary | ICD-10-CM | POA: Diagnosis not present

## 2016-12-28 DIAGNOSIS — Z992 Dependence on renal dialysis: Secondary | ICD-10-CM | POA: Diagnosis not present

## 2016-12-28 DIAGNOSIS — C787 Secondary malignant neoplasm of liver and intrahepatic bile duct: Secondary | ICD-10-CM | POA: Diagnosis present

## 2016-12-28 DIAGNOSIS — D72829 Elevated white blood cell count, unspecified: Secondary | ICD-10-CM | POA: Diagnosis present

## 2016-12-28 DIAGNOSIS — K7290 Hepatic failure, unspecified without coma: Secondary | ICD-10-CM | POA: Diagnosis present

## 2016-12-28 DIAGNOSIS — K7200 Acute and subacute hepatic failure without coma: Secondary | ICD-10-CM | POA: Diagnosis not present

## 2016-12-28 DIAGNOSIS — K279 Peptic ulcer, site unspecified, unspecified as acute or chronic, without hemorrhage or perforation: Secondary | ICD-10-CM | POA: Diagnosis present

## 2016-12-28 DIAGNOSIS — E861 Hypovolemia: Secondary | ICD-10-CM | POA: Diagnosis present

## 2016-12-28 DIAGNOSIS — N179 Acute kidney failure, unspecified: Secondary | ICD-10-CM | POA: Diagnosis not present

## 2016-12-28 DIAGNOSIS — E872 Acidosis: Secondary | ICD-10-CM | POA: Diagnosis present

## 2016-12-28 DIAGNOSIS — Z7189 Other specified counseling: Secondary | ICD-10-CM

## 2016-12-28 DIAGNOSIS — K7210 Chronic hepatic failure without coma: Secondary | ICD-10-CM | POA: Diagnosis not present

## 2016-12-28 DIAGNOSIS — R578 Other shock: Secondary | ICD-10-CM | POA: Diagnosis present

## 2016-12-28 DIAGNOSIS — C250 Malignant neoplasm of head of pancreas: Secondary | ICD-10-CM | POA: Diagnosis not present

## 2016-12-28 DIAGNOSIS — J9601 Acute respiratory failure with hypoxia: Secondary | ICD-10-CM

## 2016-12-28 DIAGNOSIS — N186 End stage renal disease: Secondary | ICD-10-CM | POA: Diagnosis present

## 2016-12-28 DIAGNOSIS — C259 Malignant neoplasm of pancreas, unspecified: Secondary | ICD-10-CM | POA: Diagnosis present

## 2016-12-28 DIAGNOSIS — Z6829 Body mass index (BMI) 29.0-29.9, adult: Secondary | ICD-10-CM | POA: Diagnosis not present

## 2016-12-28 DIAGNOSIS — J9811 Atelectasis: Secondary | ICD-10-CM | POA: Diagnosis present

## 2016-12-28 DIAGNOSIS — R188 Other ascites: Secondary | ICD-10-CM | POA: Diagnosis present

## 2016-12-28 DIAGNOSIS — N170 Acute kidney failure with tubular necrosis: Secondary | ICD-10-CM | POA: Diagnosis present

## 2016-12-28 DIAGNOSIS — Z66 Do not resuscitate: Secondary | ICD-10-CM | POA: Diagnosis not present

## 2016-12-28 DIAGNOSIS — K921 Melena: Secondary | ICD-10-CM | POA: Diagnosis present

## 2016-12-28 DIAGNOSIS — K274 Chronic or unspecified peptic ulcer, site unspecified, with hemorrhage: Secondary | ICD-10-CM | POA: Diagnosis not present

## 2016-12-28 DIAGNOSIS — T82838A Hemorrhage of vascular prosthetic devices, implants and grafts, initial encounter: Secondary | ICD-10-CM | POA: Diagnosis not present

## 2016-12-28 DIAGNOSIS — D500 Iron deficiency anemia secondary to blood loss (chronic): Secondary | ICD-10-CM | POA: Diagnosis not present

## 2016-12-28 DIAGNOSIS — Z515 Encounter for palliative care: Secondary | ICD-10-CM | POA: Diagnosis not present

## 2016-12-28 DIAGNOSIS — E43 Unspecified severe protein-calorie malnutrition: Secondary | ICD-10-CM | POA: Diagnosis present

## 2016-12-28 DIAGNOSIS — E875 Hyperkalemia: Secondary | ICD-10-CM | POA: Diagnosis present

## 2016-12-28 DIAGNOSIS — D689 Coagulation defect, unspecified: Secondary | ICD-10-CM | POA: Diagnosis present

## 2016-12-28 DIAGNOSIS — I120 Hypertensive chronic kidney disease with stage 5 chronic kidney disease or end stage renal disease: Secondary | ICD-10-CM | POA: Diagnosis present

## 2016-12-28 DIAGNOSIS — Y841 Kidney dialysis as the cause of abnormal reaction of the patient, or of later complication, without mention of misadventure at the time of the procedure: Secondary | ICD-10-CM | POA: Diagnosis not present

## 2016-12-28 DIAGNOSIS — D62 Acute posthemorrhagic anemia: Secondary | ICD-10-CM | POA: Diagnosis present

## 2016-12-28 DIAGNOSIS — K920 Hematemesis: Secondary | ICD-10-CM | POA: Diagnosis present

## 2016-12-28 LAB — HEPATIC FUNCTION PANEL
ALBUMIN: 1.2 g/dL — AB (ref 3.5–5.0)
ALK PHOS: 1224 U/L — AB (ref 38–126)
ALT: 124 U/L — AB (ref 17–63)
AST: 313 U/L — AB (ref 15–41)
BILIRUBIN DIRECT: 9.8 mg/dL — AB (ref 0.1–0.5)
BILIRUBIN TOTAL: 16.5 mg/dL — AB (ref 0.3–1.2)
Indirect Bilirubin: 6.7 mg/dL — ABNORMAL HIGH (ref 0.3–0.9)
Total Protein: 4.9 g/dL — ABNORMAL LOW (ref 6.5–8.1)

## 2016-12-28 LAB — BLOOD GAS, ARTERIAL
Acid-base deficit: 4.7 mmol/L — ABNORMAL HIGH (ref 0.0–2.0)
BICARBONATE: 18 mmol/L — AB (ref 20.0–28.0)
Drawn by: 441371
FIO2: 21
O2 SAT: 97.7 %
PATIENT TEMPERATURE: 97.9
pCO2 arterial: 22.9 mmHg — ABNORMAL LOW (ref 32.0–48.0)
pH, Arterial: 7.505 — ABNORMAL HIGH (ref 7.350–7.450)
pO2, Arterial: 101 mmHg (ref 83.0–108.0)

## 2016-12-28 LAB — RENAL FUNCTION PANEL
ALBUMIN: 1 g/dL — AB (ref 3.5–5.0)
Anion gap: 19 — ABNORMAL HIGH (ref 5–15)
BUN: 122 mg/dL — ABNORMAL HIGH (ref 6–20)
CALCIUM: 6.7 mg/dL — AB (ref 8.9–10.3)
CO2: 17 mmol/L — ABNORMAL LOW (ref 22–32)
Chloride: 93 mmol/L — ABNORMAL LOW (ref 101–111)
Creatinine, Ser: 7.63 mg/dL — ABNORMAL HIGH (ref 0.61–1.24)
GFR, EST AFRICAN AMERICAN: 8 mL/min — AB (ref >60)
GFR, EST NON AFRICAN AMERICAN: 7 mL/min — AB (ref >60)
Glucose, Bld: 125 mg/dL — ABNORMAL HIGH (ref 65–99)
PHOSPHORUS: 8 mg/dL — AB (ref 2.5–4.6)
Potassium: 5 mmol/L (ref 3.5–5.1)
SODIUM: 129 mmol/L — AB (ref 135–145)

## 2016-12-28 LAB — GLUCOSE, CAPILLARY
GLUCOSE-CAPILLARY: 99 mg/dL (ref 65–99)
GLUCOSE-CAPILLARY: 114 mg/dL — AB (ref 65–99)
Glucose-Capillary: 78 mg/dL (ref 65–99)
Glucose-Capillary: 98 mg/dL (ref 65–99)
Glucose-Capillary: 97 mg/dL (ref 65–99)

## 2016-12-28 LAB — PROTIME-INR
INR: 1.6
Prothrombin Time: 19.3 seconds — ABNORMAL HIGH (ref 11.4–15.2)

## 2016-12-28 LAB — URINALYSIS, ROUTINE W REFLEX MICROSCOPIC
BILIRUBIN URINE: NEGATIVE
Glucose, UA: NEGATIVE mg/dL
HGB URINE DIPSTICK: NEGATIVE
Ketones, ur: NEGATIVE mg/dL
Leukocytes, UA: NEGATIVE
Nitrite: NEGATIVE
PH: 5 (ref 5.0–8.0)
Protein, ur: NEGATIVE mg/dL
SPECIFIC GRAVITY, URINE: 1.001 — AB (ref 1.005–1.030)

## 2016-12-28 LAB — SODIUM, URINE, RANDOM: Sodium, Ur: <10 mmol/L

## 2016-12-28 LAB — HEMOGLOBIN AND HEMATOCRIT, BLOOD
HCT: 14.9 % — ABNORMAL LOW (ref 39.0–52.0)
HCT: 14.4 % — ABNORMAL LOW (ref 39.0–52.0)
HCT: 14 % — ABNORMAL LOW (ref 39.0–52.0)
HEMOGLOBIN: 5.1 g/dL — AB (ref 13.0–17.0)
HEMOGLOBIN: 4.9 g/dL — AB (ref 13.0–17.0)
Hemoglobin: 5.4 g/dL — CL (ref 13.0–17.0)

## 2016-12-28 LAB — BASIC METABOLIC PANEL
Anion gap: 22 — ABNORMAL HIGH (ref 5–15)
BUN: 180 mg/dL — AB (ref 6–20)
CHLORIDE: 94 mmol/L — AB (ref 101–111)
CO2: 11 mmol/L — ABNORMAL LOW (ref 22–32)
CREATININE: 10.67 mg/dL — AB (ref 0.61–1.24)
Calcium: 7.2 mg/dL — ABNORMAL LOW (ref 8.9–10.3)
GFR calc Af Amer: 5 mL/min — ABNORMAL LOW (ref >60)
GFR, EST NON AFRICAN AMERICAN: 5 mL/min — AB (ref >60)
Glucose, Bld: 96 mg/dL (ref 65–99)
POTASSIUM: 7.5 mmol/L — AB (ref 3.5–5.1)
SODIUM: 127 mmol/L — AB (ref 135–145)

## 2016-12-28 LAB — CK: Total CK: 148 U/L (ref 49–397)

## 2016-12-28 LAB — CBC WITH DIFFERENTIAL/PLATELET
BASOS PCT: 0 %
Basophils Absolute: 0 10*3/uL (ref 0.0–0.1)
Eosinophils Absolute: 0 10*3/uL (ref 0.0–0.7)
Eosinophils Relative: 0 %
HEMATOCRIT: 17 % — AB (ref 39.0–52.0)
HEMOGLOBIN: 6 g/dL — AB (ref 13.0–17.0)
LYMPHS PCT: 5 %
Lymphs Abs: 1 10*3/uL (ref 0.7–4.0)
MCH: 28.2 pg (ref 26.0–34.0)
MCHC: 35.3 g/dL (ref 30.0–36.0)
MCV: 79.8 fL (ref 78.0–100.0)
MONO ABS: 0.6 10*3/uL (ref 0.1–1.0)
Monocytes Relative: 3 %
NEUTROS PCT: 92 %
Neutro Abs: 17.6 10*3/uL — ABNORMAL HIGH (ref 1.7–7.7)
Platelets: 194 10*3/uL (ref 150–400)
RBC: 2.13 MIL/uL — ABNORMAL LOW (ref 4.22–5.81)
RDW: 16.2 % — AB (ref 11.5–15.5)
WBC: 19.2 10*3/uL — ABNORMAL HIGH (ref 4.0–10.5)

## 2016-12-28 LAB — IRON AND TIBC
IRON: 71 ug/dL (ref 45–182)
Saturation Ratios: 34 % (ref 17.9–39.5)
TIBC: 207 ug/dL — ABNORMAL LOW (ref 250–450)
UIBC: 136 ug/dL

## 2016-12-28 LAB — MAGNESIUM: Magnesium: 2.5 mg/dL — ABNORMAL HIGH (ref 1.7–2.4)

## 2016-12-28 LAB — TROPONIN I
Troponin I: 0.09 ng/mL (ref <0.03)
Troponin I: 0.09 ng/mL (ref <0.03)
Troponin I: 0.09 ng/mL (ref <0.03)

## 2016-12-28 LAB — FIBRINOGEN: FIBRINOGEN: 685 mg/dL — AB (ref 210–475)

## 2016-12-28 LAB — LACTIC ACID, PLASMA
LACTIC ACID, VENOUS: 4.2 mmol/L — AB (ref 0.5–1.9)
Lactic Acid, Venous: 3.6 mmol/L (ref 0.5–1.9)

## 2016-12-28 LAB — APTT: APTT: 40 s — AB (ref 24–36)

## 2016-12-28 LAB — PREPARE RBC (CROSSMATCH)

## 2016-12-28 LAB — MRSA PCR SCREENING: MRSA by PCR: NEGATIVE

## 2016-12-28 LAB — LACTATE DEHYDROGENASE: LDH: 2366 U/L — ABNORMAL HIGH (ref 98–192)

## 2016-12-28 LAB — PHOSPHORUS: PHOSPHORUS: 11.7 mg/dL — AB (ref 2.5–4.6)

## 2016-12-28 LAB — CREATININE, URINE, RANDOM

## 2016-12-28 LAB — HIV ANTIBODY (ROUTINE TESTING W REFLEX): HIV SCREEN 4TH GENERATION: NONREACTIVE

## 2016-12-28 MED ORDER — ALTEPLASE 2 MG IJ SOLR
2.0000 mg | Freq: Once | INTRAMUSCULAR | Status: DC | PRN
  Filled 2016-12-28: qty 2

## 2016-12-28 MED ORDER — PANTOPRAZOLE SODIUM 40 MG IV SOLR
40.0000 mg | Freq: Two times a day (BID) | INTRAVENOUS | Status: DC
  Administered 2016-12-31 – 2017-01-05 (×11): 40 mg via INTRAVENOUS
  Filled 2016-12-28 (×14): qty 40

## 2016-12-28 MED ORDER — SODIUM CHLORIDE 0.9 % IV SOLN
25.0000 mg | Freq: Three times a day (TID) | INTRAVENOUS | Status: DC | PRN
  Administered 2017-01-01 – 2017-01-08 (×10): 25 mg via INTRAVENOUS
  Filled 2016-12-28 (×21): qty 1

## 2016-12-28 MED ORDER — MIDAZOLAM HCL 2 MG/2ML IJ SOLN: 2.0000 mg | Freq: Once | INTRAMUSCULAR | Status: DC

## 2016-12-28 MED ORDER — ONDANSETRON HCL 4 MG/2ML IJ SOLN: 4.0000 mg | Freq: Once | INTRAMUSCULAR | Status: DC

## 2016-12-28 MED ORDER — SODIUM CHLORIDE 0.9 % IV SOLN
250.0000 mL | INTRAVENOUS | Status: DC | PRN
  Administered 2016-12-30: 17:00:00 via INTRAVENOUS

## 2016-12-28 MED ORDER — OXYCODONE-ACETAMINOPHEN 5-325 MG PO TABS
1.0000 | ORAL_TABLET | ORAL | Status: DC | PRN
  Administered 2016-12-31 – 2017-01-09 (×3): 1 via ORAL
  Administered 2017-01-11 – 2017-01-14 (×7): 2 via ORAL
  Filled 2016-12-28: qty 1
  Filled 2016-12-28 (×2): qty 2
  Filled 2016-12-28: qty 1
  Filled 2016-12-28 (×3): qty 2
  Filled 2016-12-28: qty 1
  Filled 2016-12-28: qty 2
  Filled 2016-12-28: qty 1
  Filled 2016-12-28: qty 2

## 2016-12-28 MED ORDER — SODIUM CHLORIDE 0.9 % IV SOLN: 100.0000 mL | INTRAVENOUS | Status: DC | PRN

## 2016-12-28 MED ORDER — ONDANSETRON HCL 4 MG/2ML IJ SOLN
4.0000 mg | Freq: Four times a day (QID) | INTRAMUSCULAR | Status: DC | PRN
  Administered 2016-12-30 – 2017-01-12 (×14): 4 mg via INTRAVENOUS
  Filled 2016-12-28 (×16): qty 2

## 2016-12-28 MED ORDER — SODIUM CHLORIDE 0.9 % IV SOLN
8.0000 mg/h | INTRAVENOUS | Status: AC
  Administered 2016-12-28 – 2016-12-30 (×6): 8 mg/h via INTRAVENOUS
  Filled 2016-12-28 (×14): qty 80

## 2016-12-28 MED ORDER — LIDOCAINE HCL (PF) 1 % IJ SOLN: 5.0000 mL | INTRAMUSCULAR | Status: DC | PRN

## 2016-12-28 MED ORDER — WHITE PETROLATUM GEL
Status: AC
  Administered 2016-12-28: 1
  Filled 2016-12-28: qty 1

## 2016-12-28 MED ORDER — SODIUM CHLORIDE 0.9 % IV SOLN
Freq: Once | INTRAVENOUS | Status: AC
  Administered 2016-12-28: 1000 mL via INTRAVENOUS

## 2016-12-28 MED ORDER — LACTULOSE 10 GM/15ML PO SOLN
40.0000 g | Freq: Once | ORAL | Status: DC
  Filled 2016-12-28: qty 60

## 2016-12-28 MED ORDER — LIDOCAINE-PRILOCAINE 2.5-2.5 % EX CREA: 1.0000 "application " | TOPICAL_CREAM | CUTANEOUS | Status: DC | PRN

## 2016-12-28 MED ORDER — PENTAFLUOROPROP-TETRAFLUOROETH EX AERO: 1.0000 "application " | INHALATION_SPRAY | CUTANEOUS | Status: DC | PRN

## 2016-12-28 MED ORDER — HEPARIN SODIUM (PORCINE) 1000 UNIT/ML DIALYSIS: 1000.0000 [IU] | INTRAMUSCULAR | Status: DC | PRN

## 2016-12-28 MED ORDER — DEXTROSE 5 % IV SOLN
0.0000 ug/min | INTRAVENOUS | Status: DC
  Administered 2016-12-28: 20 ug/min via INTRAVENOUS
  Administered 2016-12-28: 3 ug/min via INTRAVENOUS
  Administered 2016-12-28: 35 ug/min via INTRAVENOUS
  Filled 2016-12-28 (×3): qty 4

## 2016-12-28 MED ORDER — VITAMIN K1 10 MG/ML IJ SOLN
5.0000 mg | Freq: Once | INTRAVENOUS | Status: AC
  Administered 2016-12-28: 5 mg via INTRAVENOUS
  Filled 2016-12-28: qty 0.5

## 2016-12-28 MED ORDER — SODIUM CHLORIDE 0.9 % IV SOLN
Freq: Once | INTRAVENOUS | Status: AC
  Administered 2016-12-28: 01:00:00 via INTRAVENOUS

## 2016-12-28 NOTE — Progress Notes (Signed)
Critical labs for potassium, Troponin, and Lactic Acid given to Madalyn Rob NP

## 2016-12-28 NOTE — Consult Note (Signed)
Reason for Consult:AKI, Henry Barton Referring Physician: Dr. Antonieta Loveless is an 56 y.o. male.  HPI: 73yrmale with hx [facility]  Last chemo last Tues.  Has felt bad for a week.  Having D at least 3 d, with up to 20 BM/d.  Having blood in BM. Severely weak, no appetite, tremulous, sweating, V, also.  Found to have Cr of 10/BUN180 and K >7. Transferred for HD.  Cr 1.3 2/18.  On Lisinopril until 1 monago. Taking Naprosyn daily.   Review of systems not obtained due to patient factors.   Past Medical History:  Diagnosis Date  . Cancer ([facility]    pancreatic, liver  . Hypertension   . Peptic ulcer disease    Nonbleeding & remote    Past Surgical History:  Procedure Laterality Date  . HERNIA REPAIR     age 30  . PERCUTANEOUS LIVER BIOPSY  10/2016  . VASECTOMY      Family History  Problem Relation Age of Onset  . Lung cancer Maternal Grandmother     Social History:  reports that he has never smoked. He has never used smokeless tobacco. He reports that he does not drink alcohol. His drug history is not on file.  Allergies: No Known Allergies  Medications:  I have reviewed the patient's current medications. Prior to Admission:  Prescriptions Prior to Admission  Medication Sig Dispense Refill Last Dose  . capecitabine (XELODA) 500 MG tablet Take 1,000 mg by mouth 2 (two) times daily. TAKE 1000MG BY MOUTH IN AM AND 1000MG BY MOUTH IN PM. TAKE WITH WATER AFTER MEALS DAYS 1-14 AND STOP ON DAYS 15-21   12/27/2016 at Unknown time  . CREON 24000-76000 units CPEP Take 2 capsules by mouth 4 (four) times daily.  11 12/27/2016 at Unknown time  . dexamethasone (DECADRON) 4 MG tablet Take 8 mg by mouth as directed. Take 895mby mouth every morning x 2 days after the first day of each cycle then UTD  2 Past Week at Unknown time  . gabapentin (NEURONTIN) 100 MG capsule Take 100 mg by mouth 3 (three) times daily. Hiccups  0 Past Week at Unknown time  .  lidocaine-prilocaine (EMLA) cream Apply 1 application topically as directed.  2   . Multiple Vitamin (MULTIVITAMIN) capsule Take 1 capsule by mouth daily.   12/26/2016 at Unknown time  . naproxen (NAPROSYN) 500 MG tablet Take 500 mg by mouth 2 (two) times daily with a meal.   12/26/2016 at Unknown time  . ondansetron (ZOFRAN) 8 MG tablet Take 8 mg by mouth every 12 (twelve) hours as needed for nausea/vomiting.   12/27/2016 at Unknown time    Results for orders placed or performed during the hospital encounter of 12/27/16 (from the past 48 hour(s))  Basic metabolic panel     Status: Abnormal   Collection Time: 12/27/16  7:27 PM  Result Value Ref Range   Sodium 126 (L) 135 - 145 mmol/L   Potassium >7.5 (HH) 3.5 - 5.1 mmol/L    Comment: CRITICAL RESULT CALLED TO, READ BACK BY AND VERIFIED WITH: K.RIDGE AT 2039 12/27/16 BY N.THOMPSON    Chloride 92 (L) 101 - 111 mmol/L   CO2 8 (L) 22 - 32 mmol/L   Glucose, Bld 87 65 - 99 mg/dL   BUN 173 (H) 6 - 20 mg/dL    Comment: RESULTS CONFIRMED BY MANUAL DILUTION   Creatinine, Ser 10.69 (H) 0.61 - 1.24  mg/dL   Calcium 7.8 (L) 8.9 - 10.3 mg/dL   GFR calc non Af Amer 5 (L) >60 mL/min   GFR calc Af Amer 5 (L) >60 mL/min    Comment: (NOTE) The eGFR has been calculated using the CKD EPI equation. This calculation has not been validated in all clinical situations. eGFR's persistently <60 mL/min signify possible Chronic Kidney Disease.    Anion gap 26 (H) 5 - 15    Comment: REPEATED TO VERIFY  CBC     Status: Abnormal   Collection Time: 12/27/16  7:27 PM  Result Value Ref Range   WBC 26.5 (H) 4.0 - 10.5 K/uL   RBC 1.44 (L) 4.22 - 5.81 MIL/uL   Hemoglobin 4.3 (LL) 13.0 - 17.0 g/dL    Comment: RESULT REPEATED AND VERIFIED CRITICAL RESULT CALLED TO, READ BACK BY AND VERIFIED WITH: T DOSTER RN @ 2014 ON 12/27/16 BY C DAVIS    HCT 12.0 (L) 39.0 - 52.0 %   MCV 83.3 78.0 - 100.0 fL   MCH 29.9 26.0 - 34.0 pg   MCHC 35.8 30.0 - 36.0 g/dL   RDW 19.1 (H) 11.5 -  15.5 %   Platelets 280 150 - 400 K/uL  Type and screen [place]     Status: None (Preliminary result)   Collection Time: 12/27/16  7:27 PM  Result Value Ref Range   ABO/RH(D) A POS    Antibody Screen NEG    Sample Expiration 12/30/2016    Unit Number [id number]    Blood Component Type RED CELLS,LR    Unit division 00    Status of Unit ISSUED,FINAL    Transfusion Status OK TO TRANSFUSE    Crossmatch Result Compatible    Unit Number [id number]    Blood Component Type RED CELLS,LR    Unit division 00    Status of Unit ISSUED    Transfusion Status OK TO TRANSFUSE    Crossmatch Result Compatible    Unit Number [id number]    Blood Component Type RED CELLS,LR    Unit division 00    Status of Unit ISSUED    Transfusion Status OK TO TRANSFUSE    Crossmatch Result Compatible   Protime-INR     Status: Abnormal   Collection Time: 12/27/16  7:27 PM  Result Value Ref Range   Prothrombin Time 22.6 (H) 11.4 - 15.2 seconds   INR 1.96   Hepatic function panel     Status: Abnormal   Collection Time: 12/27/16  7:27 PM  Result Value Ref Range   Total Protein 5.6 (L) 6.5 - 8.1 g/dL   Albumin 1.3 (L) 3.5 - 5.0 g/dL   AST 302 (H) 15 - 41 U/L    Comment: RESULTS CONFIRMED BY MANUAL DILUTION   ALT 131 (H) 17 - 63 U/L   Alkaline Phosphatase 1,285 (H) 38 - 126 U/L   Total Bilirubin 16.9 (H) 0.3 - 1.2 mg/dL   Bilirubin, Direct 10.0 (H) 0.1 - 0.5 mg/dL   Indirect Bilirubin 6.9 (H) 0.3 - 0.9 mg/dL  ABO/Rh     Status: None   Collection Time: 12/27/16  7:27 PM  Result Value Ref Range   ABO/RH(D) A POS   CBG monitoring, ED     Status: None   Collection Time: 12/27/16  7:35 PM  Result Value Ref Range   Glucose-Capillary 84 65 - 99 mg/dL  POC occult blood, ED     Status: Abnormal   Collection Time: 12/27/16  7:42 PM  Result Value Ref Range   Fecal Occult Bld POSITIVE (A) NEGATIVE  Prepare RBC     Status: None   Collection Time: 12/27/16  8:30 PM  Result Value Ref  Range   Order Confirmation ORDER PROCESSED BY BLOOD BANK   Basic metabolic panel     Status: Abnormal   Collection Time: 12/27/16 10:37 PM  Result Value Ref Range   Sodium 128 (L) 135 - 145 mmol/L   Potassium 7.2 (HH) 3.5 - 5.1 mmol/L    Comment: NO VISIBLE HEMOLYSIS CRITICAL RESULT CALLED TO, READ BACK BY AND VERIFIED WITH: K RIDGE RN @ 2312 ON 12/27/16 BY C DAVIS    Chloride 95 (L) 101 - 111 mmol/L   CO2 10 (L) 22 - 32 mmol/L   Glucose, Bld 93 65 - 99 mg/dL   BUN 175 (H) 6 - 20 mg/dL    Comment: RESULTS CONFIRMED BY MANUAL DILUTION   Creatinine, Ser 10.75 (H) 0.61 - 1.24 mg/dL   Calcium 7.7 (L) 8.9 - 10.3 mg/dL   GFR calc non Af Amer 5 (L) >60 mL/min   GFR calc Af Amer 5 (L) >60 mL/min    Comment: (NOTE) The eGFR has been calculated using the CKD EPI equation. This calculation has not been validated in all clinical situations. eGFR's persistently <60 mL/min signify possible Chronic Kidney Disease.    Anion gap 23 (H) 5 - 15  Prepare RBC     Status: None   Collection Time: 12/28/16  1:00 AM  Result Value Ref Range   Order Confirmation ORDER PROCESSED BY BLOOD BANK   MRSA PCR Screening     Status: None   Collection Time: 12/28/16  5:22 AM  Result Value Ref Range   MRSA by PCR NEGATIVE NEGATIVE    Comment:        The GeneXpert MRSA Assay (FDA approved for NASAL specimens only), is one component of a comprehensive MRSA colonization surveillance program. It is not intended to diagnose MRSA infection nor to guide or monitor treatment for MRSA infections.   Glucose, capillary     Status: None   Collection Time: 12/28/16  5:23 AM  Result Value Ref Range   Glucose-Capillary 78 65 - 99 mg/dL   Comment 1 Notify RN   Basic metabolic panel     Status: Abnormal   Collection Time: 12/28/16  6:47 AM  Result Value Ref Range   Sodium 127 (L) 135 - 145 mmol/L   Potassium 7.5 (HH) 3.5 - 5.1 mmol/L    Comment: CRITICAL RESULT CALLED TO, READ BACK BY AND VERIFIED  WITH: D.HOVANDER,RN 0750 12/28/16 CLARK,S    Chloride 94 (L) 101 - 111 mmol/L   CO2 11 (L) 22 - 32 mmol/L   Glucose, Bld 96 65 - 99 mg/dL   BUN 180 (H) 6 - 20 mg/dL   Creatinine, Ser 10.67 (H) 0.61 - 1.24 mg/dL    Comment: ICTERUS AT THIS LEVEL MAY AFFECT RESULT   Calcium 7.2 (L) 8.9 - 10.3 mg/dL   GFR calc non Af Amer 5 (L) >60 mL/min   GFR calc Af Amer 5 (L) >60 mL/min    Comment: (NOTE) The eGFR has been calculated using the CKD EPI equation. This calculation has not been validated in all clinical situations. eGFR's persistently <60 mL/min signify possible Chronic Kidney Disease.    Anion gap 22 (H) 5 - 15  Fibrinogen     Status: Abnormal   Collection Time: 12/28/16  6:47  AM  Result Value Ref Range   Fibrinogen 685 (H) 210 - 475 mg/dL  Magnesium     Status: Abnormal   Collection Time: 12/28/16  6:47 AM  Result Value Ref Range   Magnesium 2.5 (H) 1.7 - 2.4 mg/dL  Phosphorus     Status: Abnormal   Collection Time: 12/28/16  6:47 AM  Result Value Ref Range   Phosphorus 11.7 (H) 2.5 - 4.6 mg/dL  Troponin I     Status: Abnormal   Collection Time: 12/28/16  6:47 AM  Result Value Ref Range   Troponin I 0.09 (HH) <0.03 ng/mL    Comment: CRITICAL RESULT CALLED TO, READ BACK BY AND VERIFIED WITH: D.HOVANDER,RN 0750 12/28/16 CLARK,S   Lactic acid, plasma     Status: Abnormal   Collection Time: 12/28/16  6:47 AM  Result Value Ref Range   Lactic Acid, Venous 3.6 (HH) 0.5 - 1.9 mmol/L    Comment: CRITICAL RESULT CALLED TO, READ BACK BY AND VERIFIED WITH: [facility] 7341 12/28/16 CLARK,S   APTT     Status: Abnormal   Collection Time: 12/28/16  6:47 AM  Result Value Ref Range   aPTT 40 (H) 24 - 36 seconds    Comment:        IF BASELINE aPTT IS ELEVATED, SUGGEST PATIENT RISK ASSESSMENT BE USED TO DETERMINE APPROPRIATE ANTICOAGULANT THERAPY.   CBC with Differential/Platelet     Status: Abnormal   Collection Time: 12/28/16  6:47 AM  Result Value Ref Range   WBC 19.2 (H) 4.0 -  10.5 K/uL   RBC 2.13 (L) 4.22 - 5.81 MIL/uL   Hemoglobin 6.0 (LL) 13.0 - 17.0 g/dL    Comment: REPEATED TO VERIFY CRITICAL RESULT CALLED TO, READ BACK BY AND VERIFIED WITH: LINDSAY FLOOD,RN AT 9379 12/28/16 BY ZBEECH.    HCT 17.0 (L) 39.0 - 52.0 %   MCV 79.8 78.0 - 100.0 fL   MCH 28.2 26.0 - 34.0 pg   MCHC 35.3 30.0 - 36.0 g/dL   RDW 16.2 (H) 11.5 - 15.5 %   Platelets 194 150 - 400 K/uL   Neutrophils Relative % 92 %   Lymphocytes Relative 5 %   Monocytes Relative 3 %   Eosinophils Relative 0 %   Basophils Relative 0 %   Neutro Abs 17.6 (H) 1.7 - 7.7 K/uL   Lymphs Abs 1.0 0.7 - 4.0 K/uL   Monocytes Absolute 0.6 0.1 - 1.0 K/uL   Eosinophils Absolute 0.0 0.0 - 0.7 K/uL   Basophils Absolute 0.0 0.0 - 0.1 K/uL   RBC Morphology TARGET CELLS    WBC Morphology TOXIC GRANULATION     Comment: MILD LEFT SHIFT (1-5% METAS, OCC MYELO, OCC BANDS)  Hepatic function panel     Status: Abnormal   Collection Time: 12/28/16  6:47 AM  Result Value Ref Range   Total Protein 4.9 (L) 6.5 - 8.1 g/dL   Albumin 1.2 (L) 3.5 - 5.0 g/dL   AST 313 (H) 15 - 41 U/L   ALT 124 (H) 17 - 63 U/L   Alkaline Phosphatase 1,224 (H) 38 - 126 U/L   Total Bilirubin 16.5 (H) 0.3 - 1.2 mg/dL   Bilirubin, Direct 9.8 (H) 0.1 - 0.5 mg/dL   Indirect Bilirubin 6.7 (H) 0.3 - 0.9 mg/dL  Protime-INR     Status: Abnormal   Collection Time: 12/28/16  6:47 AM  Result Value Ref Range   Prothrombin Time 19.3 (H) 11.4 - 15.2 seconds   INR 1.60  Glucose, capillary     Status: None   Collection Time: 12/28/16  8:04 AM  Result Value Ref Range   Glucose-Capillary 99 65 - 99 mg/dL   Comment 1 Notify RN    Comment 2 Document in Chart     US Renal Port  Result Date: 12/28/2016 CLINICAL DATA:  Acute renal failure EXAM: RENAL / URINARY TRACT ULTRASOUND COMPLETE COMPARISON:  CT from 11/02/2016, ultrasound from 10/21/2016 FINDINGS: Right Kidney: Length: 9.9 cm. Increased echogenicity. Mild caliectasis of the upper pole. No mass or  hydronephrosis visualized. Left Kidney: Length: 11.8 cm. Increased echogenicity. No mass or hydronephrosis visualized. Bladder: Nondistended suboptimally assessed. Additional findings: Heterogeneous masslike abnormalities within the liver with adjacent ascites. Surface nodularity of the liver raises concern for cirrhosis. IMPRESSION: 1. Echogenic kidneys bilaterally consistent with medical renal disease. No obstructive uropathy. 2. Known masslike abnormalities of the liver with adjacent ascites. Electronically Signed   By: Ashley Royalty M.D.   On: 12/28/2016 02:39   Dg Chest Port 1 View  Result Date: 12/28/2016 CLINICAL DATA:  Weakness and hypotension, onset around noon EXAM: PORTABLE CHEST 1 VIEW COMPARISON:  None. FINDINGS: Right-sided port extends to the expected location of the cavoatrial junction. Mild right hemidiaphragm elevation. No airspace consolidation. Normal pulmonary vasculature. No large effusion. Unremarkable hilar, mediastinal and cardiac contours. IMPRESSION: No acute cardiopulmonary findings. Electronically Signed   By: Andreas Newport M.D.   On: 12/28/2016 01:11   Dg Abd Portable 1v  Result Date: 12/28/2016 CLINICAL DATA:  Weakness and hypotension, onset around noon. Hematochezia. EXAM: PORTABLE ABDOMEN - 1 VIEW COMPARISON:  None. FINDINGS: Moderate gaseous distention of the stomach. Scattered air throughout unobstructed small and large bowel. No extraluminal gas is evident on this single AP supine image. IMPRESSION: Moderate gaseous distention of the stomach. Electronically Signed   By: Andreas Newport M.D.   On: 12/28/2016 01:11    ROS Blood pressure 106/67, pulse 87, temperature 98.1 F (36.7 C), temperature source Oral, resp. rate 13, height 5' 10"  (1.778 m), weight 84.6 kg (186 lb 8.2 oz), SpO2 100 %. Physical Exam Physical Examination: General appearance - uncomfortable, tremulous, emaciated Mental status - slowed mentation,miserable, cannot answer most questions but  oriented Mouth - mucous membranes moist, pharynx normal without lesions Neck - adenopathy noted PCL Lymphatics - posterior cervical nodes Chest - rales noted bibasilar, rhonchi noted bibasilar Heart - S1 and S2 normal, reg Tachy 100-120 Abdomen - distended, tender, Abelina Bachelor Neurological - as above ms, moves all extrem sym facies Musculoskeletal - no joint tenderness, deformity or swelling Extremities - peripheral pulses normal, no pedal edema, no clubbing or cyanosis, no pedal edema noted Skin - diffuse acnform rash on face  Assessment/Plan: 1 AKI  Suspect vol but may be toxic from Chemo.  Low vol and NSAID most likely but not clear.  Vol low, but primary issue is uremia and Henry Barton,.acidemia. NEEDS emergent HD, will try conventional for effic in K.  If unstable convert to CRRt 2 Pancreatic Ca  He is in very poor condition 3 Hypertension not an issue 4. D ?? infx vs chemo  P HD, foley, UA , urine chem, counsel, follow K  Dezaree Tracey L 12/28/2016, 10:36 AM

## 2016-12-28 NOTE — ED Notes (Signed)
Cannot draw labs at this time  Pt receiving blood transfusion

## 2016-12-28 NOTE — Progress Notes (Addendum)
PULMONARY  / CRITICAL CARE MEDICINE  Name: Henry Barton MRN: [id number] DOB: 1960-11-22    LOS: 0  REFERRING MD :  Winfred Leeds  CHIEF COMPLAINT:  weakness  BRIEF PATIENT DESCRIPTION: Henry Barton is a 56 y.o. male with PMH as outlined below including pancreatic cancer with mets to liver, currently undergoing chemotherapy under the care Dr. Berneice Gandy at [facility] (diagnosed 11/10/16 by liver biopsy, started chemo 11/29/16). He presented to [facility] ED on 4/3 with weakness and hypotension. Symptoms began around noon that afternoon. He had also been having dark bloody stools that started 1 day prior along with intermittent lightheadedness. hgb 4.3, FOBT positive, K >7.5 in   STUDIES:  LIVER BIOPSY 11/10/16:  Metastatic adenocarcinoma consistent with pancreatic primary. EKG 4/3:  Personally reviewed by me. Borderline QRS complex widening. Some peaking of the T waves. QTc prolonged.  MICROBIOLOGY: None.  ANTIBIOTICS: None.  SIGNIFICANT EVENTS: 04/03 - Admit with GI Bleeding & Hyperkalemia  LINES/TUBES: PIV x2  INTERVAL HISTORY: C/o pain on the right chest and N/V but no other complaints  VITAL SIGNS: Temp:  [97.5 F (36.4 C)-98.4 F (36.9 C)] 98.1 F (36.7 C) (04/04 0805) Pulse Rate:  [78-90] 87 (04/04 0800) Resp:  [13-25] 14 (04/04 0800) BP: (82-133)/(51-81) 106/65 (04/04 0800) SpO2:  [98 %-100 %] 100 % (04/04 0800) Weight:  [174 lb (78.9 kg)-186 lb 8.2 oz (84.6 kg)] 186 lb 8.2 oz (84.6 kg) (04/04 0500)  HEMODYNAMICS:   VENTILATOR SETTINGS:   INTAKE / OUTPUT: Intake/Output      04/03 0701 - 04/04 0700 04/04 0701 - 04/05 0700   I.V. (mL/kg) 1107.1 (13.1)    Blood 1340    Total Intake(mL/kg) 2447.1 (28.9)    Net +2447.1           PHYSICAL EXAMINATION: General:  NAD, well nourished Neuro:  AAO x3. PERRLA, EOMI HEENT:  Moist mucous membranes, b/l scleral icterus Cardiovascular:  RRR, no murmurs/r/g Lungs:  CTA B, no crackles Abdomen: distended, mid epigastric hard mass  palpated, active BS and diffuse tenderness Musculoskeletal:  2+ pedal edema on left, 3+ pedal edema on right Skin:  wamr and dry  LABS: Cbc  Recent Labs Lab 12/27/16 1927  WBC 26.5*  HGB 4.3*  HCT 12.0*  PLT 280   Chemistry  Recent Labs Lab 12/27/16 1927 12/27/16 2237 12/28/16 0647  NA 126* 128* 127*  K >7.5* 7.2* 7.5*  CL 92* 95* 94*  CO2 8* 10* 11*  BUN 173* 175* 180*  CREATININE 10.69* 10.75* 10.67*  CALCIUM 7.8* 7.7* 7.2*  MG  --   --  2.5*  PHOS  --   --  11.7*  GLUCOSE 87 93 96   Liver fxn  Recent Labs Lab 12/27/16 1927  AST 302*  ALT 131*  ALKPHOS 1,285*  BILITOT 16.9*  PROT 5.6*  ALBUMIN 1.3*   coags  Recent Labs Lab 12/27/16 1927  INR 1.96   Sepsis markers  Recent Labs Lab 12/28/16 0647  LATICACIDVEN 3.6*   Cardiac markers  Recent Labs Lab 12/28/16 0647  TROPONINI 0.09*   BNP No results for input(s): PROBNP in the last 168 hours. ABG No results for input(s): PHART, PCO2ART, PO2ART, HCO3, TCO2 in the last 168 hours.  CBG trend  Recent Labs Lab 12/27/16 1935 12/28/16 0523 12/28/16 0804  GLUCAP 84 78 99    IMAGING:  ECG:  DIAGNOSES: Active Problems:   GI bleed   ASSESSMENT / PLAN:   RENAL  ASSESSMENT:   Hyperkalemic,  tx with one dose of kayexalate without any BM since admission.  AKI- Cr 10.69 from 1.34 11/10/16 Pseudohypocalcemia - Corrects. Likely due to hypoalbuminemia. Renal u/s 4/4 neg for hydronephrosis  PLAN:   - Renal consulted, needs HD - Place HD cath and foley cath ordered - Continuing Bicarb drip - Strict I/Os  GASTROINTESTINAL  ASSESSMENT:   Acute Upper GI Bleeding - Likely due to peptic ulcer in setting of NSAID use daily. Transaminitis - Worsening. Known pancreatic cancer with liver mets. Moderate Protein-Calorie Malnutrition  FOBT positive  PLAN:   - Thorazine 25mg  q8h prn for hiccups  - Checking LDH and haptoglobin for hemolysis as he had scleral icterus x 3 weeks - SCDs -  Protonix  - GI consult if Hg continues to drop or hepatic failure worsens - May need to consider   HEMATOLOGIC  Acute Blood Loss Anemia - Likely due to upper GI bleed. Coagulopathy - Multifactorial. Pancreatic Cancer with Liver Mets - Follows with Dr. Berneice Gandy at [facility].   P:  Transfusing a total of 3u PRBC -- post cbc pending Trending Hgb/Hct q4hr Plan to transfuse for Hgb <7.0 or active bleeding Fibrinogen and PT elevated likely 2/2 to liver disease SCDs  PULMONARY A: No acute issues.  P:   Monitoring continuous pulse oximetry.  Monitor for airway protection Patient would like short term intubation only  CARDIOVASCULAR A:  No acute issues.  P:  Monitoring patient on telemetry Repeat EKG in AM given hyperkalemia Trending Troponin I q6hr w/ first lab elevated in setting of demand/supply ischemia 2/2 severe anemia. Not having any test pain.  Checking TTE given lower extremity edema HD for hyperkalemia  INFECTIOUS A:   Leukocytosis - Suspect demargination & increased bone marrow production with blood loss.  P:   Trending cell counts Monitoring for signs of infection.   ENDOCRINE A:   Possible Hypoglycemia - Received IV insulin in setting of acute renal failure.    P:   Monitoring glucose with accu-checks q4hr with MD notification parameters.  NEUROLOGIC A:   No acute issues.  P:   Monitor closely in ICU.   Julious Oka, MD Internal Medicine Resident, PGY [facility] Pager: [phone]  Attending Note: 56 year old with stage 4 pancreatic cancer who presents with generalized weakness, liver and renal failure.  Patient was admitted to [facility] for evaluation.  On exam, she is alert and interactive, moving all ext to command.  I reviewed CXR myself, no acute disease noted.  Will hold off abx.  Place HD catheter for dialysis.  Will send haptoglobulin and LDH for work up for hemolysis.  Spoke with patient, will continue LCB with no  CPR and short term intubation only.  If continues for vitals to be stable then will need either transfer to or discussion with H/O in [place].  The patient is critically ill with multiple organ systems failure and requires high complexity decision making for assessment and support, frequent evaluation and titration of therapies, application of advanced monitoring technologies and extensive interpretation of multiple databases.   Critical Care Time devoted to patient care services described in this note is  35  Minutes. This time reflects time of care of this signee Dr Jennet Maduro. This critical care time does not reflect procedure time, or teaching time or supervisory time of PA/NP/Med student/Med Resident etc but could involve care discussion time.  Rush Farmer, M.D. [facility] Pulmonary/Critical Care Medicine. Pager: [phone]. After hours pager: [phone].  12/28/2016,  8:22 AM

## 2016-12-28 NOTE — Progress Notes (Signed)
Dialysis treatment completed.  0 mL ultrafiltrated.  -1000 mL net fluid removal.  Patient difficult to arouse. Lung sounds diminished to ausculation in all fields. Generalized edema. Cardiac: NSR to ST.  Cleansed RIJ catheter with chlorhexidine.  Disconnected lines and flushed ports with saline per protocol.  Ports locked with heparin and capped per protocol.    Report given to bedside, RN Timmothy Sours.

## 2016-12-28 NOTE — Consult Note (Signed)
Consultation Note Date: 12/28/2016   Patient Name: NEMESIO CASTRILLON  DOB: 12-26-1960  MRN: [id number]  Age / Sex: 56 y.o., male  PCP: Pcp Not In System Referring Physician: Javier Glazier, MD  Reason for Consultation: Establishing goals of care and Psychosocial/spiritual support  HPI/Patient Profile: 56 y.o. male   admitted on 12/27/2016 with  PMH pancreatic cancer with mets to liver, currently undergoing chemotherapy under the care Dr. Berneice Gandy at [facility] (diagnosed 11/10/16 by liver biopsy, started chemo 11/29/16).  I  spoke with Dr Blobe/oncologist/[facility] C# [phone] regarding this patient.  He tells me that he has been frank with Mr Eckert regarding his poor prognosis.  He tells me today he does not believe that aggressive medical intervetnions are in the best interest of this patient.  He believes that his anemia/GI bleed is 2/2 to tumor erosion into the GI tract, and that the patient was close to "close to liver failure on initial diagnosis 2/2 to metastasis"   He is available for any needs, feel free to call.  Labs from ED notable for multiple significant metabolic derangements including Na 126, K > 7.5 (s/p calcium, insulin, D50, HCO3), anion gap 26, SCr 10.69 (1.34 just 2 months prior), alkaline phosphatase 1285, AST 302, ALT 131, WBC 26.5, Hgb 4.3, FOBT Positive.  Admitted under CCM.  Multiple metabolic derangements, acute on chronic kidney disease, acute blood loss/ GI bleed.   Patient and his family face advanced directive and EOL decisions along with anticipatory care needs.    Clinical Assessment and Goals of Care:  This NP Wadie Lessen reviewed medical records, received report from team, assessed the patient and then meet at the patient's bedside   to discuss diagnosis, prognosis, GOC,  and options.  I then spoke with his wife and daughter at bedside later this afternoon to again discuss diagnosis,  prognosis, GOC, EOL wishes disposition and options. They all intellectually understand the very poor prognosis but remain hopeful for improvement.  A  discussion was had today regarding advanced directives.  Concepts specific to code status was had.   Family was very specific to their wishes.  Daughter/ Loma Sousa is a NP and understands the seriousness of the situation and poor prognosis.  Daughter Valarie Merino is trying to get into [place] from [place]   The difference between a aggressive medical intervention path  and a palliative comfort care path for this patient at this time was had.  Values and goals of care important to patient and family were attempted to be elicited.  Concept of Hospice and Palliative Care were discussed.  Family had lots of questions regarding hospice care, no decision to de-escalate care at this time.   Questions and concerns addressed.   Family encouraged to call with questions or concerns.  PMT will continue to support holistically.  PATIENT with the support of his family    SUMMARY OF RECOMMENDATIONS    Code Status/Advance Care Planning:  Limited code  Very detailed discussion initiated by family regarding code status.  It is  very clear that they are requesting medications to treat arrhythmias and blood pressure.  They are requesting defibrillation.  No intubation, No compressions  Symptom Management:   Pain: Morphine 1 mg IV every 3 hrs prn  We discussed that medications to treat pain may also affect blood pressure.  Family understand the correlation and ultimately the goal is to prevent pain and suffering.  Palliative Prophylaxis:   Aspiration, Bowel Regimen, Frequent Pain Assessment and Oral Care  Additional Recommendations (Limitations, Scope, Preferences):  Full Scope Treatment  Psycho-social/Spiritual:   Desire for further Chaplaincy support:yes  Additional Recommendations: Education on Hospice  Prognosis:   Days to weeks; dependant on desire  for life prolonging interventions.  Discharge Planning: To Be Determined      Primary Diagnoses: Present on Admission: . GI bleed   I have reviewed the medical record, interviewed the patient and family, and examined the patient. The following aspects are pertinent.  Past Medical History:  Diagnosis Date  . Cancer [facility])    pancreatic, liver  . Hypertension   . Peptic ulcer disease    Nonbleeding & remote   Social History   Social History  . Marital status: Married    Spouse name: N/A  . Number of children: N/A  . Years of education: N/A   Social History Main Topics  . Smoking status: Never Smoker  . Smokeless tobacco: Never Used  . Alcohol use No  . Drug use: Unknown  . Sexual activity: Not Asked   Other Topics Concern  . None   Social History Narrative   [facility] (12/27/16):   Lives with his wife and has been married for 32 years. This signifies wife is his primary decision maker with his daughter as a Naval architect. Patient has no living will or legal healthcare power of attorney signified.   Family History  Problem Relation Age of Onset  . Lung cancer Maternal Grandmother    Scheduled Meds: . lactulose  40 g Oral Once  . ondansetron  4 mg Intravenous Once  . [START ON 12/31/2016] pantoprazole  40 mg Intravenous Q12H   Continuous Infusions: . pantoprozole (PROTONIX) infusion 8 mg/hr (12/28/16 0700)  .  sodium bicarbonate  infusion 1000 mL 125 mL/hr at 12/28/16 0700   PRN Meds:.sodium chloride, chlorproMAZINE (THORAZINE) IV, ondansetron (ZOFRAN) IV Medications Prior to Admission:  Prior to Admission medications   Medication Sig Start Date End Date Taking? Authorizing Provider  capecitabine (XELODA) 500 MG tablet Take 1,000 mg by mouth 2 (two) times daily. TAKE 1000MG  BY MOUTH IN AM AND 1000MG  BY MOUTH IN PM. TAKE WITH WATER AFTER MEALS DAYS 1-14 AND STOP ON DAYS 15-21 11/24/16  Yes Historical Provider, MD  CREON 24000-76000 units CPEP Take 2  capsules by mouth 4 (four) times daily. 11/29/16  Yes Historical Provider, MD  dexamethasone (DECADRON) 4 MG tablet Take 8 mg by mouth as directed. Take 8mg  by mouth every morning x 2 days after the first day of each cycle then UTD 11/24/16  Yes Historical Provider, MD  gabapentin (NEURONTIN) 100 MG capsule Take 100 mg by mouth 3 (three) times daily. Hiccups 12/22/16  Yes Historical Provider, MD  lidocaine-prilocaine (EMLA) cream Apply 1 application topically as directed. 11/29/16  Yes Historical Provider, MD  Multiple Vitamin (MULTIVITAMIN) capsule Take 1 capsule by mouth daily.   Yes Historical Provider, MD  naproxen (NAPROSYN) 500 MG tablet Take 500 mg by mouth 2 (two) times daily with a meal.   Yes Historical Provider,  MD  ondansetron (ZOFRAN) 8 MG tablet Take 8 mg by mouth every 12 (twelve) hours as needed for nausea/vomiting. 11/24/16  Yes Historical Provider, MD   No Known Allergies Review of Systems  Constitutional:       -patient is lethargic but is able to communicate intermittent pain right upper quadrate that radiates down his right abdomen    Physical Exam  Constitutional: He appears lethargic. He appears cachectic. He appears ill.  Cardiovascular: Tachycardia present.   Pulmonary/Chest: He has decreased breath sounds in the right lower field and the left lower field.  Abdominal: He exhibits distension. There is tenderness in the right upper quadrant.  Neurological: He appears lethargic.  Skin: Skin is warm and dry.    Vital Signs: BP 106/67   Pulse 87   Temp 98.1 F (36.7 C) (Oral)   Resp 13   Ht 5\' 10"  (1.778 m)   Wt 84.6 kg (186 lb 8.2 oz)   SpO2 100%   BMI 26.76 kg/m  Pain Assessment: 0-10   Pain Score: 6    SpO2: SpO2: 100 % O2 Device:SpO2: 100 % O2 Flow Rate: .   IO: Intake/output summary:  Intake/Output Summary (Last 24 hours) at 12/28/16 0942 Last data filed at 12/28/16 0700  Gross per 24 hour  Intake          2447.08 ml  Output                0 ml  Net           2447.08 ml    LBM: Last BM Date: 12/27/16 Baseline Weight: Weight: 78.9 kg (174 lb) Most recent weight: Weight: 84.6 kg (186 lb 8.2 oz)     Palliative Assessment/Data:  20%   Discussed with Dr Blobe/oncology at [facility] # [phone] and Georgann Housekeeper NP CCM and bedside RN  Time In: 0830 Time Out: 0945 Time Total: 75 min Greater than 50%  of this time was spent counseling and coordinating care related to the above assessment and plan.  Signed by: Wadie Lessen, NP   Please contact Palliative Medicine Team phone at [phone] for questions and concerns.  For individual provider: See Shea Evans

## 2016-12-28 NOTE — Progress Notes (Signed)
[facility] Progress Note Patient Name: Henry Barton DOB: 11-04-1960 MRN: [id number]   Date of Service  12/28/2016  HPI/Events of Note  Hgb = 5.4 >> 5.1.  eICU Interventions  Will transfuse 2 units PRBC.     Intervention Category Major Interventions: Other:  Lysle Dingwall 12/28/2016, 7:34 PM

## 2016-12-28 NOTE — Progress Notes (Signed)
Istat potassium of 4.6 communicated with Nephrology.  Will continue to monitor.

## 2016-12-28 NOTE — Procedures (Signed)
Hemodialysis Insertion Procedure Note CHAISE MAHABIR [id number] [birth year]  Procedure: Insertion of Hemodialysis Catheter Type: 3 port  Indications: Hemodialysis   Procedure Details Consent: Risks of procedure as well as the alternatives and risks of each were explained to the (patient/caregiver).  Consent for procedure obtained. Time Out: Verified patient identification, verified procedure, site/side was marked, verified correct patient position, special equipment/implants available, medications/allergies/relevent history reviewed, required imaging and test results available.  Performed  Maximum sterile technique was used including antiseptics, cap, gloves, gown, hand hygiene, mask and sheet. Skin prep: Chlorhexidine; local anesthetic administered A antimicrobial bonded/coated triple lumen catheter was placed in the right internal jugular vein using the Seldinger technique. Ultrasound guidance used.Yes.  Visualized wire in IJ. Catheter placed to 16 cm. Blood aspirated via all 3 ports and then flushed x 3. Line sutured x 2 and dressing applied.  Evaluation Blood flow good Complications: No apparent complications Patient did tolerate procedure well. Chest X-ray ordered to verify placement.  CXR: pending.  Georgann Housekeeper, AGACNP-BC [place] [facility]/Critical Care Pager [phone] or [phone]  12/28/2016 9:59 AM  Rush Farmer, M.D. [facility] Pulmonary/Critical Care Medicine. Pager: [phone]. After hours pager: [phone].

## 2016-12-28 NOTE — Progress Notes (Signed)
Arrived to patient room 56M-11 at 1132.  Reviewed treatment plan and this RN agrees with plan.  Report received from bedside RN, Timmothy Sours.  Consent obtained.  Patient A & O X 4.   Lung sounds coarse to ausculation in all fields. Generalized edema. Cardiac:  ST.  Removed caps and cleansed RIJ catheter with chlorhedxidine.  Aspirated ports of heparin and flushed them with saline per protocol.  Connected and secured lines, initiated treatment at 1143.  UF Goal of 500 mL and net fluid removal 0 L.  Will continue to monitor.

## 2016-12-28 NOTE — ED Notes (Signed)
Pt c/o abd cramping  Pt asked for emesis bag and immediately vomited large amount of burgundy color liquid emesis   Pt states after he vomited he felt much better

## 2016-12-28 NOTE — Procedures (Signed)
I was present at this session.  I have reviewed the session itself and made appropriate changes.  HD via temp cath . BP low. NE added.  Follow K.   Vesper Trant L 4/4/201812:54 PM

## 2016-12-28 NOTE — Progress Notes (Signed)
Initial Nutrition Assessment  DOCUMENTATION CODES:   Severe malnutrition in context of chronic illness  INTERVENTION:    Diet advancement as GI bleed resolves.  RD to order appropriate PO supplements when diet advanced.  NUTRITION DIAGNOSIS:   Malnutrition related to chronic illness (pancreatic cancer with liver mets) as evidenced by severe depletion of muscle mass, severe depletion of body fat, energy intake < or equal to 75% for > or equal to 1 month, percent weight loss (11-19% weight loss within 6 months).  GOAL:   Patient will meet greater than or equal to 90% of their needs  MONITOR:   Diet advancement, PO intake, Weight trends, Labs, I & O's  REASON FOR ASSESSMENT:   Malnutrition Screening Tool    ASSESSMENT:   56 yo male with PMH of pancreatic CA with liver mets (undergoing chemotherapy at [facility]) who was admitted on 4/3 with GIB and hyperkalemia. Transferred to [facility] from [facility] for HD.  Discussed patient in ICU rounds and with RN today. Currently receiving HD. Remains NPO with GIB. Labs reviewed: sodium 127 (L), potassium 7.5 (H), phosphorus 11.7 (H), magnesium 2.5 (H) Medications reviewed and include lactulose.  Patient sleeping during RD visit. His wife was at bedside and reported usual weight 208 lbs 6 months ago. His intake has declined a lot over the past 4-5 weeks. Weight has also been decreasing. Current weight likely elevated with volume overload. Patient was down to 168 lbs 2 months ago (19% weight loss). He drinks Boost sometimes, likes strawberry flavor. Wife has been trying to provide small, frequent meals to maximize intake, but patient complains of early satiety after only a few bites.  Nutrition-Focused physical exam completed. Findings are severe fat depletion, severe muscle depletion, and moderate edema.   Patient with severe PCM related to chronic illness (pancreatic cancer with liver mets) as evidenced by severe depletion of muscle mass (clavicle,  acromion bone regions), severe depletion of subcutaneous fat mass (orbital and upper arm regions), intake < 75% of estimated energy requirement for >1 month, and 11-19% weight loss within the past 6 months.  Diet Order:  Diet NPO time specified  Skin:  Reviewed, no issues  Last BM:  4/3  Height:   Ht Readings from Last 1 Encounters:  12/28/16 5\' 10"  (1.778 m)    Weight:   Wt Readings from Last 1 Encounters:  12/28/16 186 lb 8.2 oz (84.6 kg)    Ideal Body Weight:  75.5 kg  BMI:  Body mass index is 26.76 kg/m.  Estimated Nutritional Needs:   Kcal:  2500  Protein:  120-140 gm  Fluid:  2.5 L  EDUCATION NEEDS:   No education needs identified at this time  Molli Barrows, [place], [place], [place] Pager [phone] After Hours Pager [phone]

## 2016-12-28 NOTE — Progress Notes (Signed)
Spoke with wife and daughter.  Made aware that patient is no longer protecting his airway.  We have been speaking with family and examining patient intermittently.  Patient has been deteriorating intermittently throughout the day.  After discussion with the family decision was made to make patient a full DNR.  Family discussing timing of starting morphine.  Speaking with other daughter over the phone.  The patient is critically ill with multiple organ systems failure and requires high complexity decision making for assessment and support, frequent evaluation and titration of therapies, application of advanced monitoring technologies and extensive interpretation of multiple databases.   Critical Care Time devoted to patient care services described in this note is  60  Minutes. This time reflects time of care of this signee Dr Jennet Maduro. This critical care time does not reflect procedure time, or teaching time or supervisory time of PA/NP/Med student/Med Resident etc but could involve care discussion time.  Rush Farmer, M.D. [facility] Pulmonary/Critical Care Medicine. Pager: [phone]. After hours pager: [phone].

## 2016-12-28 NOTE — ED Notes (Signed)
US at bedside

## 2016-12-29 ENCOUNTER — Inpatient Hospital Stay (HOSPITAL_COMMUNITY): Payer: BLUE CROSS/BLUE SHIELD

## 2016-12-29 DIAGNOSIS — C787 Secondary malignant neoplasm of liver and intrahepatic bile duct: Secondary | ICD-10-CM

## 2016-12-29 DIAGNOSIS — C259 Malignant neoplasm of pancreas, unspecified: Secondary | ICD-10-CM

## 2016-12-29 DIAGNOSIS — Z66 Do not resuscitate: Secondary | ICD-10-CM

## 2016-12-29 LAB — RENAL FUNCTION PANEL
ANION GAP: 20 — AB (ref 5–15)
ANION GAP: 18 — AB (ref 5–15)
Albumin: 1 g/dL — ABNORMAL LOW (ref 3.5–5.0)
BUN: 134 mg/dL — ABNORMAL HIGH (ref 6–20)
BUN: 96 mg/dL — ABNORMAL HIGH (ref 6–20)
CALCIUM: 6.4 mg/dL — AB (ref 8.9–10.3)
CALCIUM: 7.4 mg/dL — AB (ref 8.9–10.3)
CO2: 19 mmol/L — AB (ref 22–32)
CO2: 23 mmol/L (ref 22–32)
CREATININE: 8.84 mg/dL — AB (ref 0.61–1.24)
Chloride: 91 mmol/L — ABNORMAL LOW (ref 101–111)
Chloride: 92 mmol/L — ABNORMAL LOW (ref 101–111)
Creatinine, Ser: 6.63 mg/dL — ABNORMAL HIGH (ref 0.61–1.24)
GFR calc Af Amer: 10 mL/min — ABNORMAL LOW (ref >60)
GFR calc non Af Amer: 8 mL/min — ABNORMAL LOW (ref >60)
GFR, EST AFRICAN AMERICAN: 7 mL/min — AB (ref >60)
GFR, EST NON AFRICAN AMERICAN: 6 mL/min — AB (ref >60)
GLUCOSE: 145 mg/dL — AB (ref 65–99)
Glucose, Bld: 96 mg/dL (ref 65–99)
PHOSPHORUS: 10.2 mg/dL — AB (ref 2.5–4.6)
POTASSIUM: 4.9 mmol/L (ref 3.5–5.1)
Phosphorus: 7.3 mg/dL — ABNORMAL HIGH (ref 2.5–4.6)
Potassium: 5.6 mmol/L — ABNORMAL HIGH (ref 3.5–5.1)
SODIUM: 130 mmol/L — AB (ref 135–145)
Sodium: 133 mmol/L — ABNORMAL LOW (ref 135–145)

## 2016-12-29 LAB — GLUCOSE, CAPILLARY
Glucose-Capillary: 97 mg/dL (ref 65–99)
Glucose-Capillary: 89 mg/dL (ref 65–99)
Glucose-Capillary: 68 mg/dL (ref 65–99)
Glucose-Capillary: 166 mg/dL — ABNORMAL HIGH (ref 65–99)
Glucose-Capillary: 139 mg/dL — ABNORMAL HIGH (ref 65–99)
Glucose-Capillary: 132 mg/dL — ABNORMAL HIGH (ref 65–99)
Glucose-Capillary: 121 mg/dL — ABNORMAL HIGH (ref 65–99)

## 2016-12-29 LAB — HEPATITIS B SURFACE ANTIBODY,QUALITATIVE: HEP B S AB: NONREACTIVE

## 2016-12-29 LAB — POCT I-STAT, CHEM 8
BUN: 114 mg/dL — ABNORMAL HIGH (ref 6–20)
BUN: 85 mg/dL — ABNORMAL HIGH (ref 6–20)
BUN: 105 mg/dL — ABNORMAL HIGH (ref 6–20)
BUN: 78 mg/dL — ABNORMAL HIGH (ref 6–20)
BUN: 72 mg/dL — AB (ref 6–20)
BUN: 94 mg/dL — ABNORMAL HIGH (ref 6–20)
BUN: 68 mg/dL — ABNORMAL HIGH (ref 6–20)
BUN: 87 mg/dL — ABNORMAL HIGH (ref 6–20)
BUN: 54 mg/dL — ABNORMAL HIGH (ref 6–20)
BUN: 77 mg/dL — ABNORMAL HIGH (ref 6–20)
CALCIUM ION: 0.76 mmol/L — AB (ref 1.15–1.40)
CALCIUM ION: 0.59 mmol/L — AB (ref 1.15–1.40)
CALCIUM ION: 0.89 mmol/L — AB (ref 1.15–1.40)
CHLORIDE: 91 mmol/L — AB (ref 101–111)
CHLORIDE: 92 mmol/L — AB (ref 101–111)
CREATININE: 5.8 mg/dL — AB (ref 0.61–1.24)
CREATININE: 7.2 mg/dL — AB (ref 0.61–1.24)
CREATININE: 5.1 mg/dL — AB (ref 0.61–1.24)
Calcium, Ion: 0.87 mmol/L — CL (ref 1.15–1.40)
Calcium, Ion: 0.48 mmol/L — CL (ref 1.15–1.40)
Calcium, Ion: 0.5 mmol/L — CL (ref 1.15–1.40)
Calcium, Ion: 0.51 mmol/L — CL (ref 1.15–1.40)
Calcium, Ion: 0.8 mmol/L — CL (ref 1.15–1.40)
Calcium, Ion: 0.54 mmol/L — CL (ref 1.15–1.40)
Calcium, Ion: 0.84 mmol/L — CL (ref 1.15–1.40)
Chloride: 92 mmol/L — ABNORMAL LOW (ref 101–111)
Chloride: 93 mmol/L — ABNORMAL LOW (ref 101–111)
Chloride: 92 mmol/L — ABNORMAL LOW (ref 101–111)
Chloride: 92 mmol/L — ABNORMAL LOW (ref 101–111)
Chloride: 91 mmol/L — ABNORMAL LOW (ref 101–111)
Chloride: 92 mmol/L — ABNORMAL LOW (ref 101–111)
Chloride: 92 mmol/L — ABNORMAL LOW (ref 101–111)
Chloride: 93 mmol/L — ABNORMAL LOW (ref 101–111)
Creatinine, Ser: 8.3 mg/dL — ABNORMAL HIGH (ref 0.61–1.24)
Creatinine, Ser: 6.5 mg/dL — ABNORMAL HIGH (ref 0.61–1.24)
Creatinine, Ser: 8 mg/dL — ABNORMAL HIGH (ref 0.61–1.24)
Creatinine, Ser: 5.6 mg/dL — ABNORMAL HIGH (ref 0.61–1.24)
Creatinine, Ser: 7.4 mg/dL — ABNORMAL HIGH (ref 0.61–1.24)
Creatinine, Ser: 4.7 mg/dL — ABNORMAL HIGH (ref 0.61–1.24)
Creatinine, Ser: 6.5 mg/dL — ABNORMAL HIGH (ref 0.61–1.24)
GLUCOSE: 156 mg/dL — AB (ref 65–99)
GLUCOSE: 180 mg/dL — AB (ref 65–99)
GLUCOSE: 164 mg/dL — AB (ref 65–99)
Glucose, Bld: 121 mg/dL — ABNORMAL HIGH (ref 65–99)
Glucose, Bld: 152 mg/dL — ABNORMAL HIGH (ref 65–99)
Glucose, Bld: 150 mg/dL — ABNORMAL HIGH (ref 65–99)
Glucose, Bld: 175 mg/dL — ABNORMAL HIGH (ref 65–99)
Glucose, Bld: 142 mg/dL — ABNORMAL HIGH (ref 65–99)
Glucose, Bld: 171 mg/dL — ABNORMAL HIGH (ref 65–99)
Glucose, Bld: 136 mg/dL — ABNORMAL HIGH (ref 65–99)
HCT: 23 % — ABNORMAL LOW (ref 39.0–52.0)
HCT: 26 % — ABNORMAL LOW (ref 39.0–52.0)
HCT: 26 % — ABNORMAL LOW (ref 39.0–52.0)
HCT: 26 % — ABNORMAL LOW (ref 39.0–52.0)
HEMATOCRIT: 17 % — AB (ref 39.0–52.0)
HEMATOCRIT: 26 % — AB (ref 39.0–52.0)
HEMATOCRIT: 24 % — AB (ref 39.0–52.0)
HEMATOCRIT: 26 % — AB (ref 39.0–52.0)
HEMATOCRIT: 25 % — AB (ref 39.0–52.0)
HEMATOCRIT: 26 % — AB (ref 39.0–52.0)
HEMOGLOBIN: 5.8 g/dL — AB (ref 13.0–17.0)
HEMOGLOBIN: 8.8 g/dL — AB (ref 13.0–17.0)
HEMOGLOBIN: 8.8 g/dL — AB (ref 13.0–17.0)
HEMOGLOBIN: 8.2 g/dL — AB (ref 13.0–17.0)
HEMOGLOBIN: 8.5 g/dL — AB (ref 13.0–17.0)
HEMOGLOBIN: 8.8 g/dL — AB (ref 13.0–17.0)
HEMOGLOBIN: 8.8 g/dL — AB (ref 13.0–17.0)
HEMOGLOBIN: 8.8 g/dL — AB (ref 13.0–17.0)
Hemoglobin: 7.8 g/dL — ABNORMAL LOW (ref 13.0–17.0)
Hemoglobin: 8.8 g/dL — ABNORMAL LOW (ref 13.0–17.0)
POTASSIUM: 4.9 mmol/L (ref 3.5–5.1)
POTASSIUM: 4.5 mmol/L (ref 3.5–5.1)
POTASSIUM: 4.9 mmol/L (ref 3.5–5.1)
Potassium: 4.6 mmol/L (ref 3.5–5.1)
Potassium: 4.8 mmol/L (ref 3.5–5.1)
Potassium: 4.7 mmol/L (ref 3.5–5.1)
Potassium: 5.1 mmol/L (ref 3.5–5.1)
Potassium: 4.6 mmol/L (ref 3.5–5.1)
Potassium: 4.4 mmol/L (ref 3.5–5.1)
Potassium: 4.7 mmol/L (ref 3.5–5.1)
SODIUM: 129 mmol/L — AB (ref 135–145)
SODIUM: 131 mmol/L — AB (ref 135–145)
SODIUM: 131 mmol/L — AB (ref 135–145)
SODIUM: 132 mmol/L — AB (ref 135–145)
SODIUM: 135 mmol/L (ref 135–145)
Sodium: 133 mmol/L — ABNORMAL LOW (ref 135–145)
Sodium: 130 mmol/L — ABNORMAL LOW (ref 135–145)
Sodium: 133 mmol/L — ABNORMAL LOW (ref 135–145)
Sodium: 134 mmol/L — ABNORMAL LOW (ref 135–145)
Sodium: 134 mmol/L — ABNORMAL LOW (ref 135–145)
TCO2: 20 mmol/L (ref 0–100)
TCO2: 23 mmol/L (ref 0–100)
TCO2: 22 mmol/L (ref 0–100)
TCO2: 24 mmol/L (ref 0–100)
TCO2: 23 mmol/L (ref 0–100)
TCO2: 25 mmol/L (ref 0–100)
TCO2: 23 mmol/L (ref 0–100)
TCO2: 25 mmol/L (ref 0–100)
TCO2: 25 mmol/L (ref 0–100)
TCO2: 26 mmol/L (ref 0–100)

## 2016-12-29 LAB — HEMOGLOBIN AND HEMATOCRIT, BLOOD
HCT: 22.5 % — ABNORMAL LOW (ref 39.0–52.0)
HEMATOCRIT: 18.2 % — AB (ref 39.0–52.0)
HEMATOCRIT: 23.1 % — AB (ref 39.0–52.0)
HEMATOCRIT: 23.3 % — AB (ref 39.0–52.0)
HEMOGLOBIN: 8.2 g/dL — AB (ref 13.0–17.0)
Hemoglobin: 6.5 g/dL — CL (ref 13.0–17.0)
Hemoglobin: 8.2 g/dL — ABNORMAL LOW (ref 13.0–17.0)
Hemoglobin: 8 g/dL — ABNORMAL LOW (ref 13.0–17.0)

## 2016-12-29 LAB — TYPE AND SCREEN
ABO/RH(D): A POS
Antibody Screen: NEGATIVE
UNIT DIVISION: 0
UNIT DIVISION: 0
Unit division: 0

## 2016-12-29 LAB — CBC
HCT: 19.8 % — ABNORMAL LOW (ref 39.0–52.0)
Hemoglobin: 7 g/dL — ABNORMAL LOW (ref 13.0–17.0)
MCH: 28.7 pg (ref 26.0–34.0)
MCHC: 35.4 g/dL (ref 30.0–36.0)
MCV: 81.1 fL (ref 78.0–100.0)
PLATELETS: 150 10*3/uL (ref 150–400)
RBC: 2.44 MIL/uL — AB (ref 4.22–5.81)
RDW: 16.5 % — ABNORMAL HIGH (ref 11.5–15.5)
WBC: 16.6 10*3/uL — ABNORMAL HIGH (ref 4.0–10.5)

## 2016-12-29 LAB — BPAM RBC
BLOOD PRODUCT EXPIRATION DATE: 201804172359
Blood Product Expiration Date: 201804232359
Blood Product Expiration Date: 201804232359
ISSUE DATE / TIME: 201804040242
ISSUE DATE / TIME: 201804032047
ISSUE DATE / TIME: 201804040001
UNIT TYPE AND RH: 6200
UNIT TYPE AND RH: 6200
Unit Type and Rh: 6200

## 2016-12-29 LAB — ECHOCARDIOGRAM COMPLETE
Height: 70 in
Weight: 3135.82 oz

## 2016-12-29 LAB — LACTATE DEHYDROGENASE: LDH: 2750 U/L — ABNORMAL HIGH (ref 98–192)

## 2016-12-29 LAB — HEPATITIS B SURFACE ANTIGEN: Hepatitis B Surface Ag: NEGATIVE

## 2016-12-29 LAB — HAPTOGLOBIN: Haptoglobin: 220 mg/dL — ABNORMAL HIGH (ref 34–200)

## 2016-12-29 LAB — HEPATITIS B CORE ANTIBODY, IGM: Hep B C IgM: NEGATIVE

## 2016-12-29 LAB — PREPARE RBC (CROSSMATCH)

## 2016-12-29 LAB — ABO/RH: ABO/RH(D): A POS

## 2016-12-29 LAB — URIC ACID: Uric Acid, Serum: 8.1 mg/dL — ABNORMAL HIGH (ref 4.4–7.6)

## 2016-12-29 MED ORDER — SODIUM CHLORIDE 0.9 % FOR CRRT
INTRAVENOUS_CENTRAL | Status: DC | PRN
  Administered 2016-12-30: 18:00:00 via INTRAVENOUS_CENTRAL
  Filled 2016-12-29 (×2): qty 1000

## 2016-12-29 MED ORDER — DEXTROSE 5 % IV SOLN
20.0000 g | INTRAVENOUS | Status: DC
  Administered 2016-12-29 – 2017-01-01 (×9): 20 g via INTRAVENOUS_CENTRAL
  Filled 2016-12-29 (×16): qty 200

## 2016-12-29 MED ORDER — PRISMASOL BGK 4/2.5 32-4-2.5 MEQ/L IV SOLN
INTRAVENOUS | Status: DC
  Administered 2016-12-29 – 2016-12-30 (×9): via INTRAVENOUS_CENTRAL
  Filled 2016-12-29 (×13): qty 5000

## 2016-12-29 MED ORDER — PRISMASOL B22GK 4/0 22-4 MEQ/L IV SOLN
INTRAVENOUS | Status: DC
Start: 2016-12-29 — End: 2017-01-02
  Administered 2016-12-29 – 2017-01-01 (×13): via INTRAVENOUS_CENTRAL
  Filled 2016-12-29 (×19): qty 5000

## 2016-12-29 MED ORDER — DEXTROSE 5 % IV SOLN
Status: DC
  Administered 2016-12-29 – 2017-01-02 (×11): via INTRAVENOUS_CENTRAL
  Filled 2016-12-29 (×21): qty 1500

## 2016-12-29 MED ORDER — SODIUM CHLORIDE 0.9 % IV SOLN: Freq: Once | INTRAVENOUS | Status: DC

## 2016-12-29 MED ORDER — HEPARIN SODIUM (PORCINE) 1000 UNIT/ML DIALYSIS
1000.0000 [IU] | INTRAMUSCULAR | Status: DC | PRN
  Administered 2016-12-30: 2400 [IU] via INTRAVENOUS_CENTRAL
  Administered 2017-01-02: 3000 [IU] via INTRAVENOUS_CENTRAL
  Filled 2016-12-29: qty 3
  Filled 2016-12-29 (×3): qty 6
  Filled 2016-12-29: qty 3

## 2016-12-29 NOTE — Progress Notes (Signed)
PULMONARY  / CRITICAL CARE MEDICINE  Name: [name] MRN: [id number] DOB: 06-23-1961    LOS: 2  REFERRING MD :  Winfred Leeds  CHIEF COMPLAINT:  weakness  BRIEF PATIENT DESCRIPTION: Henry Barton is a 56 y.o. male with PMH as outlined below including pancreatic cancer with mets to liver, currently undergoing chemotherapy under the care Dr. Berneice Gandy at [facility] (diagnosed 11/10/16 by liver biopsy, started chemo 11/29/16). He presented to [facility] ED on 4/3 with weakness and hypotension. Symptoms began around noon that afternoon. He had also been having dark bloody stools that started 1 day prior along with intermittent lightheadedness. hgb 4.3, FOBT positive, K >7.5 in   STUDIES:  LIVER BIOPSY 11/10/16:  Metastatic adenocarcinoma consistent with pancreatic primary. EKG 4/3:  Personally reviewed by me. Borderline QRS complex widening. Some peaking of the T waves. QTc prolonged.  MICROBIOLOGY: None.  ANTIBIOTICS: None.  SIGNIFICANT EVENTS: 04/03 - Admit with GI Bleeding & Hyperkalemia  LINES/TUBES: PIV x2 HD cath  4/4  INTERVAL HISTORY:Pt alert, states he feels less groggy today. Had 2 maroon colored BMs overnight. His code was changed to FULL code last night. He was given a total of 3 uRBC since last evening.    VITAL SIGNS: Temp:  [97.5 F (36.4 C)-98.7 F (37.1 C)] 97.6 F (36.4 C) (04/05 0945) Pulse Rate:  [83-103] 88 (04/05 1000) Resp:  [10-25] 14 (04/05 1000) BP: (64-120)/(44-68) 104/65 (04/05 1000) SpO2:  [98 %-100 %] 100 % (04/05 1000) Weight:  [84.6 kg (186 lb 8.2 oz)-88.9 kg (195 lb 15.8 oz)] 88.9 kg (195 lb 15.8 oz) (04/05 0301)  HEMODYNAMICS:   VENTILATOR SETTINGS:   INTAKE / OUTPUT: Intake/Output      04/04 0701 - 04/05 0700 04/05 0701 - 04/06 0700   P.O.  240   I.V. (mL/kg) 4725.7 (53.2) 620 (7)   Blood 1335 300   Total Intake(mL/kg) 6060.7 (68.2) 1160 (13)   Urine (mL/kg/hr) 260 (0.1)    Other -1000 (-0.5) 265 (0.8)   Stool 2 (0)    Total Output -738 265   Net +6798.7 +895        Stool Occurrence 1 x     PHYSICAL EXAMINATION: General:  NAD, alert and interactive Neuro:  RASS 0, moving all ext to command HEENT:  [place]/AT, PERRL, EOM-I and MMM, b/l sclera icterus Cardiovascular:  RRR, no murmurs Lungs:  Decreased BS on the right Abdomen:  Distended, palpable mass RUQ and midepigastric region, non TTP Skin:  Warm and dry Ext: 3+ pedal edema b/l, SCDs on    LABS: Cbc  Recent Labs Lab 12/27/16 1927 12/28/16 0647  12/29/16 0019 12/29/16 0445 12/29/16 1007  WBC 26.5* 19.2*  --  16.6*  --   --   HGB 4.3* 6.0*  < > 7.0* 6.5* 8.8*  HCT 12.0* 17.0*  < > 19.8* 18.2* 26.0*  PLT 280 194  --  150  --   --   < > = values in this interval not displayed. Chemistry  Recent Labs Lab 12/28/16 0647  12/28/16 1600 12/29/16 0445 12/29/16 1007  NA 127*  < > 129* 130* 133*  K 7.5*  < > 5.0 5.6* 4.8  CL 94*  < > 93* 91* 93*  CO2 11*  --  17* 19*  --   BUN 180*  < > 122* 134* 85*  CREATININE 10.67*  < > 7.63* 8.84* 6.50*  CALCIUM 7.2*  --  6.7* 6.4*  --   MG 2.5*  --   --   --   --  PHOS 11.7*  --  8.0* 10.2*  --   GLUCOSE 96  < > 125* 96 152*  < > = values in this interval not displayed. Liver fxn  Recent Labs Lab 12/27/16 1927 12/28/16 0647 12/28/16 1600 12/29/16 0445  AST 302* 313*  --   --   ALT 131* 124*  --   --   ALKPHOS 1,285* 1,224*  --   --   BILITOT 16.9* 16.5*  --   --   PROT 5.6* 4.9*  --   --   ALBUMIN 1.3* 1.2* 1.0* <1.0*   coags  Recent Labs Lab 12/27/16 1927 12/28/16 0647  APTT  --  40*  INR 1.96 1.60   Sepsis markers  Recent Labs Lab 12/28/16 0647 12/28/16 0944  LATICACIDVEN 3.6* 4.2*   Cardiac markers  Recent Labs Lab 12/28/16 0647 12/28/16 1210 12/28/16 1829  CKTOTAL  --  148  --   TROPONINI 0.09* 0.09* 0.09*   BNP No results for input(s): PROBNP in the last 168 hours. ABG  Recent Labs Lab 12/28/16 1359 12/28/16 1430 12/29/16 1007  PHART  --  7.505*  --   PCO2ART  --  22.9*  --    PO2ART  --  101  --   HCO3  --  18.0*  --   TCO2 20  --  23    CBG trend  Recent Labs Lab 12/28/16 1606 12/28/16 1935 12/28/16 2322 12/29/16 0340 12/29/16 0822  GLUCAP 114* 97 97 89 68    IMAGING:  ECG:  DIAGNOSES:   ASSESSMENT / PLAN:   RENAL  ASSESSMENT:   Hyperkalemic, tx with one dose of kayexalate without any BM since admission.  AKI- Cr 10.69 from 1.34 11/10/16 Pseudohypocalcemia - Corrects. Likely due to hypoalbuminemia. Renal u/s 4/4 neg for hydronephrosis  PLAN:   - Per renal - CRRT per renal  GASTROINTESTINAL  ASSESSMENT:   Acute Upper GI Bleeding - Likely due to peptic ulcer in setting of NSAID use daily vs tumor erosion into small bowel Transaminitis - Worsening. Known pancreatic cancer with liver mets. Moderate Protein-Calorie Malnutrition  FOBT positive  PLAN:   - Thorazine 25mg  q8h prn for hiccups  - LDH elevated and haptoglobin elevated, unlikely hemolysis - SCDs - Protonix  - GI consult if Hg continues to drop or hepatic failure worsens  HEMATOLOGIC  Acute Blood Loss Anemia - Likely due to upper GI bleed. Coagulopathy - Multifactorial. Pancreatic Cancer with Liver Mets - Follows with Dr. Berneice Gandy at [facility].   P:  - Transfusing a total of 3u PRBC -- post cbc pending - Trending Hgb/Hct q4hr - Plan to transfuse for Hgb <7.0 or active bleeding - Fibrinogen and PT elevated likely 2/2 to liver disease - SCDs - Palliative care following - Percocet q4h prn for pain  PULMONARY A: No acute issues.  P:   - Monitoring continuous pulse oximetry.  - Monitor for airway protection - Patient would like short term intubation only  CARDIOVASCULAR A:  No acute issues. trops flat  P:  - Monitoring patient on telemetry - Checking TTE given lower extremity edema  INFECTIOUS A:   Leukocytosis - Suspect demargination & increased bone marrow production with blood loss.  P:   - Trending cell counts - Monitoring for signs of  infection.   ENDOCRINE A:   Possible Hypoglycemia - Received IV insulin in setting of acute renal failure.    P:   - Monitoring glucose with accu-checks q4hr with MD notification parameters.  NEUROLOGIC A:   No acute issues.  P:   - Monitor closely in ICU.   Spoke with patient and family, code status was reversed yesterday.  After discussion, decision was made to make patient a LCB with no CPR/cardioversion/intubation but continue full medical therapy otherwise.  The patient is critically ill with multiple organ systems failure and requires high complexity decision making for assessment and support, frequent evaluation and titration of therapies, application of advanced monitoring technologies and extensive interpretation of multiple databases.   Critical Care Time devoted to patient care services described in this note is  35  Minutes. This time reflects time of care of this signee Dr Jennet Maduro. This critical care time does not reflect procedure time, or teaching time or supervisory time of PA/NP/Med student/Med Resident etc but could involve care discussion time.  Rush Farmer, M.D. [facility] Pulmonary/Critical Care Medicine. Pager: [phone]. After hours pager: [phone].  12/29/2016, 10:34 AM

## 2016-12-29 NOTE — Progress Notes (Signed)
Responded to Spiritual Care Consult to support patient and family at bedside. Patient and Family just decided to make patient a DNR.  I explored with patient and family current spiritual care needs and concerns.  Patient is from [place] and was here on work assignment. Wife and daughters are at bedside supporting patient.  Wife said that their were other immediate family and work Biochemist, clinical that were in route from [place] and should arrive mid to late evening today. Family has requested use [employer].  This has been coordinated with Chaplain. Provided prayer per patient and family request, also emotional and grief support, reflective and empathetic listening and presence.  Will follow as needed.   12/29/16 1037  Clinical Encounter Type  Visited With Patient and family together;Health care provider  Visit Type Initial;Spiritual support;Patient actively dying  Referral From Nurse;Other (Comment) ([place] Consult)  Spiritual Encounters  Spiritual Needs Prayer;Emotional;Grief support  Stress Factors  Patient Stress Factors Major life changes  Family Stress Factors Exhausted;Major life changes  Jaclynn Major, [place]

## 2016-12-29 NOTE — Progress Notes (Signed)
I was called at beside by RN to talk to the patient and his family about the code status. After discussing with his family and clarifying what each option means, he together with his family decided to change his code status to FULL Code including CPR, Intubation, medications, and everything else. He stated that if he was to be intubated, he would want the maximum length on ventilator to be 30 days unless he is showing signs of improvement.    I have made the change in the chart.

## 2016-12-29 NOTE — Progress Notes (Signed)
Subjective: Interval History: has no complaint , feels better but foggy.  Objective: Vital signs in last 24 hours: Temp:  [97.5 F (36.4 C)-98.7 F (37.1 C)] 97.5 F (36.4 C) (04/05 0339) Pulse Rate:  [84-103] 95 (04/05 0700) Resp:  [10-25] 17 (04/05 0700) BP: (64-120)/(44-68) 103/63 (04/05 0700) SpO2:  [99 %-100 %] 100 % (04/05 0700) Weight:  [84.6 kg (186 lb 8.2 oz)-88.9 kg (195 lb 15.8 oz)] 88.9 kg (195 lb 15.8 oz) (04/05 0301) Weight change: 5.674 kg (12 lb 8.2 oz)  Intake/Output from previous day: 04/04 0701 - 04/05 0700 In: 6060.7 [I.V.:4725.7; Blood:1335] Out: -017 [Urine:260; Stool:2] Intake/Output this shift: No intake/output data recorded.  General appearance: alert, cooperative, no distress and slowed mentation Neck: IJ cath Resp: rales bibasilar and rhonchi bibasilar Cardio: S1, S2 normal and systolic murmur: holosystolic 2/6, blowing at apex GI: mod distension, liver down 8 cm,pos bs Extremities: edema 1+  Lab Results:  Recent Labs  12/28/16 0647  12/29/16 0019 12/29/16 0445  WBC 19.2*  --  16.6*  --   HGB 6.0*  < > 7.0* 6.5*  HCT 17.0*  < > 19.8* 18.2*  PLT 194  --  150  --   < > = values in this interval not displayed. BMET:  Recent Labs  12/28/16 1600 12/29/16 0445  NA 129* 130*  K 5.0 5.6*  CL 93* 91*  CO2 17* 19*  GLUCOSE 125* 96  BUN 122* 134*  CREATININE 7.63* 8.84*  CALCIUM 6.7* 6.4*   No results for input(s): PTH in the last 72 hours. Iron Studies:  Recent Labs  12/28/16 1210  IRON 71  TIBC 207*    Studies/Results: US Renal Port  Result Date: 12/28/2016 CLINICAL DATA:  Acute renal failure EXAM: RENAL / URINARY TRACT ULTRASOUND COMPLETE COMPARISON:  CT from 11/02/2016, ultrasound from 10/21/2016 FINDINGS: Right Kidney: Length: 9.9 cm. Increased echogenicity. Mild caliectasis of the upper pole. No mass or hydronephrosis visualized. Left Kidney: Length: 11.8 cm. Increased echogenicity. No mass or hydronephrosis visualized. Bladder:  Nondistended suboptimally assessed. Additional findings: Heterogeneous masslike abnormalities within the liver with adjacent ascites. Surface nodularity of the liver raises concern for cirrhosis. IMPRESSION: 1. Echogenic kidneys bilaterally consistent with medical renal disease. No obstructive uropathy. 2. Known masslike abnormalities of the liver with adjacent ascites. Electronically Signed   By: Ashley Royalty M.D.   On: 12/28/2016 02:39   Dg Chest Port 1 View  Result Date: 12/28/2016 CLINICAL DATA:  Central line placement portable chest x-ray of earlier today EXAM: PORTABLE CHEST 1 VIEW COMPARISON:  Portable chest x-ray of earlier today. FINDINGS: There has been interval placement of a large caliber right internal jugular venous catheter. The tip projects over the midportion of the SVC. There is no postprocedure pneumothorax or hemothorax. The Port-A-Cath appliance tip is again located over the distal SVC. The lungs are reasonably well inflated and clear. The heart is top-normal in size. The pulmonary vascularity is normal. IMPRESSION: No postprocedure complication following right internal jugular venous catheter placement. Electronically Signed   By: David  Martinique M.D.   On: 12/28/2016 10:48   Dg Chest Port 1 View  Result Date: 12/28/2016 CLINICAL DATA:  Weakness and hypotension, onset around noon EXAM: PORTABLE CHEST 1 VIEW COMPARISON:  None. FINDINGS: Right-sided port extends to the expected location of the cavoatrial junction. Mild right hemidiaphragm elevation. No airspace consolidation. Normal pulmonary vasculature. No large effusion. Unremarkable hilar, mediastinal and cardiac contours. IMPRESSION: No acute cardiopulmonary findings. Electronically Signed   By:  Andreas Newport M.D.   On: 12/28/2016 01:11   Dg Abd Portable 1v  Result Date: 12/28/2016 CLINICAL DATA:  Weakness and hypotension, onset around noon. Hematochezia. EXAM: PORTABLE ABDOMEN - 1 VIEW COMPARISON:  None. FINDINGS: Moderate gaseous  distention of the stomach. Scattered air throughout unobstructed small and large bowel. No extraluminal gas is evident on this single AP supine image. IMPRESSION: Moderate gaseous distention of the stomach. Electronically Signed   By: Andreas Newport M.D.   On: 12/28/2016 01:11    I have reviewed the patient's current medications.  Assessment/Plan: 1 AKI oliguric ATN, vs AIN.  Vol xs mild. Acidemic.^K, ^ solute.  Better after HD yest but rising . Will check LDH, uric acid. ? Tumor tx.  2 Pancreatic Ca 3 Bloody D 4 Anemia GI and BM suppress 5 Low bps, ?bleed vs infx P CRRT, citrate, check LDH, Uric acid    LOS: 1 day   Janeah Kovacich L 12/29/2016,7:08 AM

## 2016-12-29 NOTE — Progress Notes (Signed)
Patient ID: Fransisca Kaufmann, male   DOB: 1961/03/23, 56 y.o.   MRN: [id number]   This NP visited patient at the bedside as a follow up to  yesterday's [place].  Patient's mother and brother are present.   Patient "feels much better" and is enjoying his time with family member who have come from out of town.  Also spoke with daughter Raneece.  All understand the seriousness of the situation but remain hopeful for continued improvement and more quality time.  Discussed with patient the importance of continued conversation with family and their  medical providers regarding overall plan of care and treatment options and their intentions,  ensuring decisions are within the context of the patients values and GOCs.  Questions and concerns addressed  I informed family I will not be back in the hospital until Monday but will have a PMT provider check in for needs, family is encouraged to call with questions or concerns.  Currently patient is comfortable and has voices  no complaints.   Time in   1700        Time out    1735  Total time 35 minutes  Greater than 50% of the time was spent in counseling and coordination of care  Wadie Lessen NP  Palliative Medicine Team Team Phone # [phone] Pager [phone]

## 2016-12-29 NOTE — Progress Notes (Signed)
  Echocardiogram 2D Echocardiogram has been performed.  Henry Barton M 12/29/2016, 12:49 PM

## 2016-12-29 NOTE — Progress Notes (Signed)
PULMONARY  / CRITICAL CARE MEDICINE  Name: Henry Barton MRN: [id number] DOB: 09-Mar-1961    LOS: 70  REFERRING MD :  Winfred Leeds  CHIEF COMPLAINT:  weakness  BRIEF PATIENT DESCRIPTION: Henry Barton is a 56 y.o. male with PMH as outlined below including pancreatic cancer with mets to liver, currently undergoing chemotherapy under the care Dr. Berneice Gandy at [facility] (diagnosed 11/10/16 by liver biopsy, started chemo 11/29/16). He presented to [facility] ED on 4/3 with weakness and hypotension. Symptoms began around noon that afternoon. He had also been having dark bloody stools that started 1 day prior along with intermittent lightheadedness. hgb 4.3, FOBT positive, K >7.5 in   STUDIES:  LIVER BIOPSY 11/10/16:  Metastatic adenocarcinoma consistent with pancreatic primary. EKG 4/3:  Personally reviewed by me. Borderline QRS complex widening. Some peaking of the T waves. QTc prolonged.  MICROBIOLOGY: None.  ANTIBIOTICS: None.  SIGNIFICANT EVENTS: 04/03 - Admit with GI Bleeding & Hyperkalemia  LINES/TUBES: PIV x2 HD cath  4/4  INTERVAL HISTORY:Pt alert, states he feels less groggy today. Had 2 maroon colored BMs overnight. His code was changed to FULL code last night. He was given a total of 3 uRBC since last evening.    VITAL SIGNS: Temp:  [97.5 F (36.4 C)-98.7 F (37.1 C)] 97.5 F (36.4 C) (04/05 0339) Pulse Rate:  [84-103] 95 (04/05 0700) Resp:  [10-25] 17 (04/05 0700) BP: (64-120)/(44-68) 103/63 (04/05 0700) SpO2:  [99 %-100 %] 100 % (04/05 0700) Weight:  [186 lb 8.2 oz (84.6 kg)-195 lb 15.8 oz (88.9 kg)] 195 lb 15.8 oz (88.9 kg) (04/05 0301)  HEMODYNAMICS:   VENTILATOR SETTINGS:   INTAKE / OUTPUT: Intake/Output      04/04 0701 - 04/05 0700 04/05 0701 - 04/06 0700   I.V. (mL/kg) 4725.7 (53.2)    Blood 1335    Total Intake(mL/kg) 6060.7 (68.2)    Urine (mL/kg/hr) 260 (0.1)    Other -1000 (-0.5)    Stool 2 (0)    Total Output -738     Net +6798.7          Stool Occurrence 1 x      PHYSICAL EXAMINATION: General:  NAD, alert and interactive Neuro:  RASS 0, moving all ext to command HEENT:  [place]/AT, PERRL, EOM-I and MMM, b/l sclera icterus Cardiovascular:  RRR, no murmurs Lungs:  Decreased BS on the right Abdomen:  Distended, palpable mass RUQ and midepigastric region, non TTP Skin:  Warm and dry Ext: 3+ pedal edema b/l, SCDs on    LABS: Cbc  Recent Labs Lab 12/27/16 1927 12/28/16 0647  12/28/16 2102 12/29/16 0019 12/29/16 0445  WBC 26.5* 19.2*  --   --  16.6*  --   HGB 4.3* 6.0*  < > 4.9* 7.0* 6.5*  HCT 12.0* 17.0*  < > 14.0* 19.8* 18.2*  PLT 280 194  --   --  150  --   < > = values in this interval not displayed. Chemistry  Recent Labs Lab 12/28/16 0647 12/28/16 1600 12/29/16 0445  NA 127* 129* 130*  K 7.5* 5.0 5.6*  CL 94* 93* 91*  CO2 11* 17* 19*  BUN 180* 122* 134*  CREATININE 10.67* 7.63* 8.84*  CALCIUM 7.2* 6.7* 6.4*  MG 2.5*  --   --   PHOS 11.7* 8.0* 10.2*  GLUCOSE 96 125* 96   Liver fxn  Recent Labs Lab 12/27/16 1927 12/28/16 0647 12/28/16 1600 12/29/16 0445  AST 302* 313*  --   --  ALT 131* 124*  --   --   ALKPHOS 1,285* 1,224*  --   --   BILITOT 16.9* 16.5*  --   --   PROT 5.6* 4.9*  --   --   ALBUMIN 1.3* 1.2* 1.0* <1.0*   coags  Recent Labs Lab 12/27/16 1927 12/28/16 0647  APTT  --  40*  INR 1.96 1.60   Sepsis markers  Recent Labs Lab 12/28/16 0647 12/28/16 0944  LATICACIDVEN 3.6* 4.2*   Cardiac markers  Recent Labs Lab 12/28/16 0647 12/28/16 1210 12/28/16 1829  CKTOTAL  --  148  --   TROPONINI 0.09* 0.09* 0.09*   BNP No results for input(s): PROBNP in the last 168 hours. ABG  Recent Labs Lab 12/28/16 1430  PHART 7.505*  PCO2ART 22.9*  PO2ART 101  HCO3 18.0*    CBG trend  Recent Labs Lab 12/28/16 1207 12/28/16 1606 12/28/16 1935 12/28/16 2322 12/29/16 0340  GLUCAP 98 114* 97 97 89    IMAGING:  ECG:  DIAGNOSES: Active Problems:   GI bleed   Acute renal failure  (ARF) (HCC)   Pancreatic carcinoma metastatic to liver ([place])   Palliative care by specialist   DNR (do not resuscitate) discussion   ASSESSMENT / PLAN:   RENAL  ASSESSMENT:   Hyperkalemic, tx with one dose of kayexalate without any BM since admission.  AKI- Cr 10.69 from 1.34 11/10/16 Pseudohypocalcemia - Corrects. Likely due to hypoalbuminemia. Renal u/s 4/4 neg for hydronephrosis  PLAN:   Per renal   GASTROINTESTINAL  ASSESSMENT:   Acute Upper GI Bleeding - Likely due to peptic ulcer in setting of NSAID use daily vs tumor erosion into small bowel Transaminitis - Worsening. Known pancreatic cancer with liver mets. Moderate Protein-Calorie Malnutrition  FOBT positive  PLAN:   - Thorazine 25mg  q8h prn for hiccups  - LDH elevated and haptoglobin elevated, unlikely hemolysis - SCDs - Protonix  - GI consult if Hg continues to drop or hepatic failure worsens  HEMATOLOGIC  Acute Blood Loss Anemia - Likely due to upper GI bleed. Coagulopathy - Multifactorial. Pancreatic Cancer with Liver Mets - Follows with Dr. Ber[facility] at [facility].   P:  Transfusing a total of 3u PRBC -- post cbc pending Trending Hgb/Hct q4hr Plan to transfuse for Hgb <7.0 or active bleeding Fibrinogen and PT elevated likely 2/2 to liver disease SCDs Palliative care following Percocet q4h prn for pain  PULMONARY A: No acute issues.  P:   Monitoring continuous pulse oximetry.  Monitor for airway protection Patient would like short term intubation only  CARDIOVASCULAR A:  No acute issues. trops flat  P:  Monitoring patient on telemetry Checking TTE given lower extremity edema  INFECTIOUS A:   Leukocytosis - Suspect demargination & increased bone marrow production with blood loss.  P:   Trending cell counts Monitoring for signs of infection.   ENDOCRINE A:   Possible Hypoglycemia - Received IV insulin in setting of acute renal failure.    P:   Monitoring glucose with accu-checks  q4hr with MD notification parameters.  NEUROLOGIC A:   No acute issues.  P:   Monitor closely in ICU.   Julious Oka, MD Internal Medicine Resident, PGY [facility] Internal Medicine Program Pager: [phone]    12/29/2016, 7:25 AM

## 2016-12-29 NOTE — Progress Notes (Signed)
AM hemoglobin 6.5, ordered 1 unit PRBCs

## 2016-12-30 ENCOUNTER — Encounter (HOSPITAL_COMMUNITY)
Admission: EM | Disposition: A | Discharge status: Hospice | Payer: Self-pay | Source: Home / Self Care | Attending: Pulmonary Disease

## 2016-12-30 ENCOUNTER — Inpatient Hospital Stay (HOSPITAL_COMMUNITY): Payer: BLUE CROSS/BLUE SHIELD

## 2016-12-30 DIAGNOSIS — R7989 Other specified abnormal findings of blood chemistry: Secondary | ICD-10-CM

## 2016-12-30 DIAGNOSIS — E43 Unspecified severe protein-calorie malnutrition: Secondary | ICD-10-CM

## 2016-12-30 DIAGNOSIS — C250 Malignant neoplasm of head of pancreas: Secondary | ICD-10-CM

## 2016-12-30 DIAGNOSIS — K921 Melena: Principal | ICD-10-CM

## 2016-12-30 DIAGNOSIS — N179 Acute kidney failure, unspecified: Secondary | ICD-10-CM

## 2016-12-30 DIAGNOSIS — K922 Gastrointestinal hemorrhage, unspecified: Secondary | ICD-10-CM

## 2016-12-30 LAB — POCT I-STAT, CHEM 8
BUN: 29 mg/dL — AB (ref 6–20)
BUN: 30 mg/dL — ABNORMAL HIGH (ref 6–20)
BUN: 31 mg/dL — ABNORMAL HIGH (ref 6–20)
BUN: 32 mg/dL — AB (ref 6–20)
BUN: 34 mg/dL — ABNORMAL HIGH (ref 6–20)
BUN: 34 mg/dL — ABNORMAL HIGH (ref 6–20)
BUN: 35 mg/dL — ABNORMAL HIGH (ref 6–20)
BUN: 37 mg/dL — AB (ref 6–20)
BUN: 38 mg/dL — ABNORMAL HIGH (ref 6–20)
BUN: 40 mg/dL — AB (ref 6–20)
BUN: 40 mg/dL — AB (ref 6–20)
BUN: 42 mg/dL — AB (ref 6–20)
BUN: 45 mg/dL — AB (ref 6–20)
BUN: 47 mg/dL — AB (ref 6–20)
BUN: 48 mg/dL — ABNORMAL HIGH (ref 6–20)
BUN: 50 mg/dL — AB (ref 6–20)
BUN: 54 mg/dL — AB (ref 6–20)
BUN: 54 mg/dL — ABNORMAL HIGH (ref 6–20)
BUN: 60 mg/dL — AB (ref 6–20)
BUN: 65 mg/dL — ABNORMAL HIGH (ref 6–20)
BUN: 70 mg/dL — AB (ref 6–20)
CALCIUM ION: 0.41 mmol/L — AB (ref 1.15–1.40)
CALCIUM ION: 0.43 mmol/L — AB (ref 1.15–1.40)
CALCIUM ION: 0.43 mmol/L — AB (ref 1.15–1.40)
CALCIUM ION: 0.49 mmol/L — AB (ref 1.15–1.40)
CALCIUM ION: 0.53 mmol/L — AB (ref 1.15–1.40)
CALCIUM ION: 0.54 mmol/L — AB (ref 1.15–1.40)
CALCIUM ION: 0.6 mmol/L — AB (ref 1.15–1.40)
CALCIUM ION: 0.62 mmol/L — AB (ref 1.15–1.40)
CALCIUM ION: 1.19 mmol/L (ref 1.15–1.40)
CALCIUM ION: 1.27 mmol/L (ref 1.15–1.40)
CALCIUM ION: 1.27 mmol/L (ref 1.15–1.40)
CHLORIDE: 91 mmol/L — AB (ref 101–111)
CHLORIDE: 85 mmol/L — AB (ref 101–111)
CHLORIDE: 90 mmol/L — AB (ref 101–111)
CHLORIDE: 91 mmol/L — AB (ref 101–111)
CHLORIDE: 89 mmol/L — AB (ref 101–111)
CHLORIDE: 89 mmol/L — AB (ref 101–111)
CHLORIDE: 92 mmol/L — AB (ref 101–111)
CHLORIDE: 89 mmol/L — AB (ref 101–111)
CHLORIDE: 90 mmol/L — AB (ref 101–111)
CHLORIDE: 91 mmol/L — AB (ref 101–111)
CHLORIDE: 90 mmol/L — AB (ref 101–111)
CHLORIDE: 90 mmol/L — AB (ref 101–111)
CREATININE: 3.2 mg/dL — AB (ref 0.61–1.24)
CREATININE: 4.1 mg/dL — AB (ref 0.61–1.24)
CREATININE: 4.3 mg/dL — AB (ref 0.61–1.24)
CREATININE: 4.6 mg/dL — AB (ref 0.61–1.24)
CREATININE: 4.6 mg/dL — AB (ref 0.61–1.24)
CREATININE: 5.5 mg/dL — AB (ref 0.61–1.24)
CREATININE: 5.5 mg/dL — AB (ref 0.61–1.24)
CREATININE: 6 mg/dL — AB (ref 0.61–1.24)
Calcium, Ion: 0.45 mmol/L — CL (ref 1.15–1.40)
Calcium, Ion: 0.48 mmol/L — CL (ref 1.15–1.40)
Calcium, Ion: 0.58 mmol/L — CL (ref 1.15–1.40)
Calcium, Ion: 0.62 mmol/L — CL (ref 1.15–1.40)
Calcium, Ion: 1.26 mmol/L (ref 1.15–1.40)
Calcium, Ion: 0.5 mmol/L — CL (ref 1.15–1.40)
Calcium, Ion: 1.25 mmol/L (ref 1.15–1.40)
Calcium, Ion: 0.5 mmol/L — CL (ref 1.15–1.40)
Calcium, Ion: 1.24 mmol/L (ref 1.15–1.40)
Calcium, Ion: 0.52 mmol/L — CL (ref 1.15–1.40)
Chloride: 86 mmol/L — ABNORMAL LOW (ref 101–111)
Chloride: 87 mmol/L — ABNORMAL LOW (ref 101–111)
Chloride: 88 mmol/L — ABNORMAL LOW (ref 101–111)
Chloride: 89 mmol/L — ABNORMAL LOW (ref 101–111)
Chloride: 91 mmol/L — ABNORMAL LOW (ref 101–111)
Chloride: 91 mmol/L — ABNORMAL LOW (ref 101–111)
Chloride: 91 mmol/L — ABNORMAL LOW (ref 101–111)
Chloride: 91 mmol/L — ABNORMAL LOW (ref 101–111)
Chloride: 89 mmol/L — ABNORMAL LOW (ref 101–111)
Creatinine, Ser: 3 mg/dL — ABNORMAL HIGH (ref 0.61–1.24)
Creatinine, Ser: 3.4 mg/dL — ABNORMAL HIGH (ref 0.61–1.24)
Creatinine, Ser: 3.6 mg/dL — ABNORMAL HIGH (ref 0.61–1.24)
Creatinine, Ser: 4.2 mg/dL — ABNORMAL HIGH (ref 0.61–1.24)
Creatinine, Ser: 4.3 mg/dL — ABNORMAL HIGH (ref 0.61–1.24)
Creatinine, Ser: 4.5 mg/dL — ABNORMAL HIGH (ref 0.61–1.24)
Creatinine, Ser: 5.1 mg/dL — ABNORMAL HIGH (ref 0.61–1.24)
Creatinine, Ser: 2.5 mg/dL — ABNORMAL HIGH (ref 0.61–1.24)
Creatinine, Ser: 2.8 mg/dL — ABNORMAL HIGH (ref 0.61–1.24)
Creatinine, Ser: 2.8 mg/dL — ABNORMAL HIGH (ref 0.61–1.24)
Creatinine, Ser: 4.1 mg/dL — ABNORMAL HIGH (ref 0.61–1.24)
Creatinine, Ser: 4.2 mg/dL — ABNORMAL HIGH (ref 0.61–1.24)
Creatinine, Ser: 3.4 mg/dL — ABNORMAL HIGH (ref 0.61–1.24)
GLUCOSE: 126 mg/dL — AB (ref 65–99)
GLUCOSE: 133 mg/dL — AB (ref 65–99)
GLUCOSE: 183 mg/dL — AB (ref 65–99)
GLUCOSE: 183 mg/dL — AB (ref 65–99)
GLUCOSE: 194 mg/dL — AB (ref 65–99)
GLUCOSE: 203 mg/dL — AB (ref 65–99)
Glucose, Bld: 128 mg/dL — ABNORMAL HIGH (ref 65–99)
Glucose, Bld: 172 mg/dL — ABNORMAL HIGH (ref 65–99)
Glucose, Bld: 178 mg/dL — ABNORMAL HIGH (ref 65–99)
Glucose, Bld: 179 mg/dL — ABNORMAL HIGH (ref 65–99)
Glucose, Bld: 183 mg/dL — ABNORMAL HIGH (ref 65–99)
Glucose, Bld: 191 mg/dL — ABNORMAL HIGH (ref 65–99)
Glucose, Bld: 205 mg/dL — ABNORMAL HIGH (ref 65–99)
Glucose, Bld: 124 mg/dL — ABNORMAL HIGH (ref 65–99)
Glucose, Bld: 177 mg/dL — ABNORMAL HIGH (ref 65–99)
Glucose, Bld: 184 mg/dL — ABNORMAL HIGH (ref 65–99)
Glucose, Bld: 143 mg/dL — ABNORMAL HIGH (ref 65–99)
Glucose, Bld: 149 mg/dL — ABNORMAL HIGH (ref 65–99)
Glucose, Bld: 190 mg/dL — ABNORMAL HIGH (ref 65–99)
Glucose, Bld: 204 mg/dL — ABNORMAL HIGH (ref 65–99)
Glucose, Bld: 188 mg/dL — ABNORMAL HIGH (ref 65–99)
HCT: 19 % — ABNORMAL LOW (ref 39.0–52.0)
HCT: 20 % — ABNORMAL LOW (ref 39.0–52.0)
HCT: 23 % — ABNORMAL LOW (ref 39.0–52.0)
HCT: 24 % — ABNORMAL LOW (ref 39.0–52.0)
HCT: 24 % — ABNORMAL LOW (ref 39.0–52.0)
HCT: 25 % — ABNORMAL LOW (ref 39.0–52.0)
HCT: 27 % — ABNORMAL LOW (ref 39.0–52.0)
HCT: 22 % — ABNORMAL LOW (ref 39.0–52.0)
HCT: 25 % — ABNORMAL LOW (ref 39.0–52.0)
HCT: 22 % — ABNORMAL LOW (ref 39.0–52.0)
HCT: 27 % — ABNORMAL LOW (ref 39.0–52.0)
HEMATOCRIT: 18 % — AB (ref 39.0–52.0)
HEMATOCRIT: 22 % — AB (ref 39.0–52.0)
HEMATOCRIT: 22 % — AB (ref 39.0–52.0)
HEMATOCRIT: 23 % — AB (ref 39.0–52.0)
HEMATOCRIT: 23 % — AB (ref 39.0–52.0)
HEMATOCRIT: 23 % — AB (ref 39.0–52.0)
HEMATOCRIT: 24 % — AB (ref 39.0–52.0)
HEMATOCRIT: 24 % — AB (ref 39.0–52.0)
HEMATOCRIT: 25 % — AB (ref 39.0–52.0)
HEMATOCRIT: 28 % — AB (ref 39.0–52.0)
HEMOGLOBIN: 6.5 g/dL — AB (ref 13.0–17.0)
HEMOGLOBIN: 7.5 g/dL — AB (ref 13.0–17.0)
HEMOGLOBIN: 7.8 g/dL — AB (ref 13.0–17.0)
HEMOGLOBIN: 8.2 g/dL — AB (ref 13.0–17.0)
HEMOGLOBIN: 8.5 g/dL — AB (ref 13.0–17.0)
HEMOGLOBIN: 9.2 g/dL — AB (ref 13.0–17.0)
HEMOGLOBIN: 9.5 g/dL — AB (ref 13.0–17.0)
Hemoglobin: 6.1 g/dL — CL (ref 13.0–17.0)
Hemoglobin: 6.8 g/dL — CL (ref 13.0–17.0)
Hemoglobin: 7.8 g/dL — ABNORMAL LOW (ref 13.0–17.0)
Hemoglobin: 7.8 g/dL — ABNORMAL LOW (ref 13.0–17.0)
Hemoglobin: 7.8 g/dL — ABNORMAL LOW (ref 13.0–17.0)
Hemoglobin: 8.2 g/dL — ABNORMAL LOW (ref 13.0–17.0)
Hemoglobin: 8.2 g/dL — ABNORMAL LOW (ref 13.0–17.0)
Hemoglobin: 8.2 g/dL — ABNORMAL LOW (ref 13.0–17.0)
Hemoglobin: 8.5 g/dL — ABNORMAL LOW (ref 13.0–17.0)
Hemoglobin: 9.2 g/dL — ABNORMAL LOW (ref 13.0–17.0)
Hemoglobin: 7.5 g/dL — ABNORMAL LOW (ref 13.0–17.0)
Hemoglobin: 8.5 g/dL — ABNORMAL LOW (ref 13.0–17.0)
Hemoglobin: 7.5 g/dL — ABNORMAL LOW (ref 13.0–17.0)
Hemoglobin: 7.5 g/dL — ABNORMAL LOW (ref 13.0–17.0)
POTASSIUM: 4.2 mmol/L (ref 3.5–5.1)
POTASSIUM: 4.2 mmol/L (ref 3.5–5.1)
POTASSIUM: 4.2 mmol/L (ref 3.5–5.1)
POTASSIUM: 4.2 mmol/L (ref 3.5–5.1)
POTASSIUM: 4.3 mmol/L (ref 3.5–5.1)
POTASSIUM: 4.4 mmol/L (ref 3.5–5.1)
POTASSIUM: 4.4 mmol/L (ref 3.5–5.1)
POTASSIUM: 4.5 mmol/L (ref 3.5–5.1)
POTASSIUM: 4.5 mmol/L (ref 3.5–5.1)
POTASSIUM: 4.5 mmol/L (ref 3.5–5.1)
POTASSIUM: 4.6 mmol/L (ref 3.5–5.1)
Potassium: 4.1 mmol/L (ref 3.5–5.1)
Potassium: 4.3 mmol/L (ref 3.5–5.1)
Potassium: 4.3 mmol/L (ref 3.5–5.1)
Potassium: 4.4 mmol/L (ref 3.5–5.1)
Potassium: 4.4 mmol/L (ref 3.5–5.1)
Potassium: 4.4 mmol/L (ref 3.5–5.1)
Potassium: 4.5 mmol/L (ref 3.5–5.1)
Potassium: 4.5 mmol/L (ref 3.5–5.1)
Potassium: 4.6 mmol/L (ref 3.5–5.1)
Potassium: 4.5 mmol/L (ref 3.5–5.1)
SODIUM: 132 mmol/L — AB (ref 135–145)
SODIUM: 134 mmol/L — AB (ref 135–145)
SODIUM: 134 mmol/L — AB (ref 135–145)
SODIUM: 134 mmol/L — AB (ref 135–145)
SODIUM: 135 mmol/L (ref 135–145)
SODIUM: 135 mmol/L (ref 135–145)
SODIUM: 135 mmol/L (ref 135–145)
SODIUM: 135 mmol/L (ref 135–145)
SODIUM: 136 mmol/L (ref 135–145)
SODIUM: 136 mmol/L (ref 135–145)
Sodium: 133 mmol/L — ABNORMAL LOW (ref 135–145)
Sodium: 133 mmol/L — ABNORMAL LOW (ref 135–145)
Sodium: 133 mmol/L — ABNORMAL LOW (ref 135–145)
Sodium: 133 mmol/L — ABNORMAL LOW (ref 135–145)
Sodium: 134 mmol/L — ABNORMAL LOW (ref 135–145)
Sodium: 135 mmol/L (ref 135–145)
Sodium: 133 mmol/L — ABNORMAL LOW (ref 135–145)
Sodium: 137 mmol/L (ref 135–145)
Sodium: 132 mmol/L — ABNORMAL LOW (ref 135–145)
Sodium: 135 mmol/L (ref 135–145)
Sodium: 132 mmol/L — ABNORMAL LOW (ref 135–145)
TCO2: 27 mmol/L (ref 0–100)
TCO2: 29 mmol/L (ref 0–100)
TCO2: 26 mmol/L (ref 0–100)
TCO2: 27 mmol/L (ref 0–100)
TCO2: 29 mmol/L (ref 0–100)
TCO2: 29 mmol/L (ref 0–100)
TCO2: 30 mmol/L (ref 0–100)
TCO2: 32 mmol/L (ref 0–100)
TCO2: 26 mmol/L (ref 0–100)
TCO2: 29 mmol/L (ref 0–100)
TCO2: 26 mmol/L (ref 0–100)
TCO2: 29 mmol/L (ref 0–100)
TCO2: 31 mmol/L (ref 0–100)
TCO2: 25 mmol/L (ref 0–100)
TCO2: 26 mmol/L (ref 0–100)
TCO2: 28 mmol/L (ref 0–100)
TCO2: 25 mmol/L (ref 0–100)
TCO2: 26 mmol/L (ref 0–100)
TCO2: 27 mmol/L (ref 0–100)
TCO2: 25 mmol/L (ref 0–100)
TCO2: 27 mmol/L (ref 0–100)

## 2016-12-30 LAB — HEMOGLOBIN AND HEMATOCRIT, BLOOD
HCT: 20.4 % — ABNORMAL LOW (ref 39.0–52.0)
HEMATOCRIT: 20.6 % — AB (ref 39.0–52.0)
HEMOGLOBIN: 7.3 g/dL — AB (ref 13.0–17.0)
HEMOGLOBIN: 7.4 g/dL — AB (ref 13.0–17.0)

## 2016-12-30 LAB — BPAM RBC
BLOOD PRODUCT EXPIRATION DATE: 201804242359
BLOOD PRODUCT EXPIRATION DATE: 201804252359
Blood Product Expiration Date: 201804242359
ISSUE DATE / TIME: 201804042120
ISSUE DATE / TIME: 201804050807
ISSUE DATE / TIME: 201804042120
UNIT TYPE AND RH: 6200
UNIT TYPE AND RH: 6200
Unit Type and Rh: 6200

## 2016-12-30 LAB — TYPE AND SCREEN
ABO/RH(D): A POS
ANTIBODY SCREEN: NEGATIVE
UNIT DIVISION: 0
Unit division: 0
Unit division: 0

## 2016-12-30 LAB — RENAL FUNCTION PANEL
ANION GAP: 15 (ref 5–15)
Albumin: <1 g/dL — ABNORMAL LOW (ref 3.5–5.0)
Albumin: 1 g/dL — ABNORMAL LOW (ref 3.5–5.0)
Anion gap: 18 — ABNORMAL HIGH (ref 5–15)
BUN: 54 mg/dL — AB (ref 6–20)
BUN: 43 mg/dL — ABNORMAL HIGH (ref 6–20)
CHLORIDE: 89 mmol/L — AB (ref 101–111)
CHLORIDE: 90 mmol/L — AB (ref 101–111)
CO2: 27 mmol/L (ref 22–32)
CO2: 28 mmol/L (ref 22–32)
CREATININE: 4.6 mg/dL — AB (ref 0.61–1.24)
CREATININE: 4.21 mg/dL — AB (ref 0.61–1.24)
Calcium: 9.9 mg/dL (ref 8.9–10.3)
Calcium: 10.4 mg/dL — ABNORMAL HIGH (ref 8.9–10.3)
GFR calc Af Amer: 15 mL/min — ABNORMAL LOW (ref >60)
GFR calc Af Amer: 17 mL/min — ABNORMAL LOW (ref >60)
GFR calc non Af Amer: 15 mL/min — ABNORMAL LOW (ref >60)
GFR, EST NON AFRICAN AMERICAN: 13 mL/min — AB (ref >60)
GLUCOSE: 130 mg/dL — AB (ref 65–99)
Glucose, Bld: 84 mg/dL (ref 65–99)
POTASSIUM: 4.5 mmol/L (ref 3.5–5.1)
Phosphorus: 5.4 mg/dL — ABNORMAL HIGH (ref 2.5–4.6)
Phosphorus: 5.6 mg/dL — ABNORMAL HIGH (ref 2.5–4.6)
Potassium: 5 mmol/L (ref 3.5–5.1)
Sodium: 134 mmol/L — ABNORMAL LOW (ref 135–145)
Sodium: 133 mmol/L — ABNORMAL LOW (ref 135–145)

## 2016-12-30 LAB — GLUCOSE, CAPILLARY
GLUCOSE-CAPILLARY: 116 mg/dL — AB (ref 65–99)
GLUCOSE-CAPILLARY: 144 mg/dL — AB (ref 65–99)
GLUCOSE-CAPILLARY: 128 mg/dL — AB (ref 65–99)
Glucose-Capillary: 123 mg/dL — ABNORMAL HIGH (ref 65–99)
Glucose-Capillary: 128 mg/dL — ABNORMAL HIGH (ref 65–99)
Glucose-Capillary: 92 mg/dL (ref 65–99)

## 2016-12-30 LAB — CBC
HCT: 21.3 % — ABNORMAL LOW (ref 39.0–52.0)
Hemoglobin: 7.6 g/dL — ABNORMAL LOW (ref 13.0–17.0)
MCH: 28.8 pg (ref 26.0–34.0)
MCHC: 35.7 g/dL (ref 30.0–36.0)
MCV: 80.7 fL (ref 78.0–100.0)
PLATELETS: 103 10*3/uL — AB (ref 150–400)
RBC: 2.64 MIL/uL — ABNORMAL LOW (ref 4.22–5.81)
RDW: 15.4 % (ref 11.5–15.5)
WBC: 16.6 10*3/uL — ABNORMAL HIGH (ref 4.0–10.5)

## 2016-12-30 LAB — CALCIUM, IONIZED: CALCIUM, IONIZED, SERUM: 3.3 mg/dL — AB (ref 4.5–5.6)

## 2016-12-30 LAB — MAGNESIUM: MAGNESIUM: 2 mg/dL (ref 1.7–2.4)

## 2016-12-30 LAB — PROTIME-INR
INR: 1.34
PROTHROMBIN TIME: 16.7 s — AB (ref 11.4–15.2)

## 2016-12-30 LAB — APTT: aPTT: 36 seconds (ref 24–36)

## 2016-12-30 SURGERY — EGD (ESOPHAGOGASTRODUODENOSCOPY)
Anesthesia: Moderate Sedation

## 2016-12-30 MED ORDER — IOPAMIDOL (ISOVUE-300) INJECTION 61%
15.0000 mL | INTRAVENOUS | Status: AC
  Administered 2016-12-30 (×2): 15 mL via ORAL

## 2016-12-30 MED ORDER — "THROMBI-PAD 3""X3"" EX PADS"
1.0000 | MEDICATED_PAD | Freq: Once | CUTANEOUS | Status: AC
  Administered 2016-12-30: 1 via TOPICAL
  Filled 2016-12-30: qty 1

## 2016-12-30 MED ORDER — PRISMASOL B22GK 4/0 22-4 MEQ/L IV SOLN
INTRAVENOUS | Status: DC
  Administered 2016-12-30 – 2017-01-02 (×20): via INTRAVENOUS_CENTRAL
  Filled 2016-12-30 (×33): qty 5000

## 2016-12-30 NOTE — Progress Notes (Signed)
Subjective: Interval History: has no complaint.  Objective: Vital signs in last 24 hours: Temp:  [97.5 F (36.4 C)-98.6 F (37 C)] 98.5 F (36.9 C) (04/06 0406) Pulse Rate:  [83-114] 102 (04/06 0630) Resp:  [10-21] 13 (04/06 0630) BP: (77-135)/(49-123) 106/60 (04/06 0630) SpO2:  [83 %-100 %] 97 % (04/06 0630) Weight:  [91.3 kg (201 lb 4.8 oz)] 91.3 kg (201 lb 4.8 oz) (04/06 0200) Weight change: 6.709 kg (14 lb 12.7 oz)  Intake/Output from previous day: 04/05 0701 - 04/06 0700 In: 7606.7 [P.O.:480; I.V.:6826.7; Blood:300] Out: 5758  Intake/Output this shift: No intake/output data recorded.  General appearance: cooperative, no distress and slowed mentation Neck: RIJ cath Resp: rales bibasilar and rhonchi bibasilar Cardio: S1, S2 normal and systolic murmur: holosystolic 2/6, blowing at apex GI: distended, firm, few bs Extremities: edema 3+  Lab Results:  Recent Labs  12/28/16 0647  12/29/16 0019  12/30/16 0101 12/30/16 0453  WBC 19.2*  --  16.6*  --   --   --   HGB 6.0*  < > 7.0*  < > 7.3* 7.4*  HCT 17.0*  < > 19.8*  < > 20.4* 20.6*  PLT 194  --  150  --   --   --   < > = values in this interval not displayed. BMET:  Recent Labs  12/29/16 1546  12/29/16 1757 12/30/16 0453  NA 133*  < > 132* 134*  K 4.9  < > 4.7 4.5  CL 92*  < > 92* 89*  CO2 23  --   --  27  GLUCOSE 145*  < > 136* 130*  BUN 96*  < > 77* 54*  CREATININE 6.63*  < > 6.50* 4.60*  CALCIUM 7.4*  --   --  9.9  < > = values in this interval not displayed. No results for input(s): PTH in the last 72 hours. Iron Studies:  Recent Labs  12/28/16 1210  IRON 71  TIBC 207*    Studies/Results: Dg Chest Port 1 View  Result Date: 12/28/2016 CLINICAL DATA:  Central line placement portable chest x-ray of earlier today EXAM: PORTABLE CHEST 1 VIEW COMPARISON:  Portable chest x-ray of earlier today. FINDINGS: There has been interval placement of a large caliber right internal jugular venous catheter. The tip  projects over the midportion of the SVC. There is no postprocedure pneumothorax or hemothorax. The Port-A-Cath appliance tip is again located over the distal SVC. The lungs are reasonably well inflated and clear. The heart is top-normal in size. The pulmonary vascularity is normal. IMPRESSION: No postprocedure complication following right internal jugular venous catheter placement. Electronically Signed   By: David  Martinique M.D.   On: 12/28/2016 10:48    I have reviewed the patient's current medications.  Assessment/Plan: 1 AKI oliguric ATN, ischemic and toxic with NSAIDs, ??chemotx. Vol xs, Solute falling.Ca rising lower in Dialysate 2 GIB Hb stable 3 Metastatic pancreatic Ca 4 low bps stable P lower Ca in bath, Net neg, cont CRRt    LOS: 2 days   Henry Barton L 12/30/2016,7:13 AM

## 2016-12-30 NOTE — Progress Notes (Addendum)
PULMONARY  / CRITICAL CARE MEDICINE  Name: JAYVIEN ROWLETTE MRN: [id number] DOB: September 13, 1961    LOS: 2  REFERRING MD :  Winfred Leeds  CHIEF COMPLAINT:  weakness  BRIEF PATIENT DESCRIPTION: Henry Barton is a 56 y.o. male with PMH as outlined below including pancreatic cancer with mets to liver, currently undergoing chemotherapy under the care Dr. Berneice Gandy at [facility] (diagnosed 11/10/16 by liver biopsy, started chemo 11/29/16). He presented to [facility] ED on 4/3 with weakness and hypotension. Symptoms began around noon that afternoon. He had also been having dark bloody stools that started 1 day prior along with intermittent lightheadedness. hgb 4.3, FOBT positive, K >7.5 in   STUDIES:  LIVER BIOPSY 11/10/16:  Metastatic adenocarcinoma consistent with pancreatic primary. EKG 4/3:  Personally reviewed by me. Borderline QRS complex widening. Some peaking of the T waves. QTc prolonged.  MICROBIOLOGY: None.  ANTIBIOTICS: None.  SIGNIFICANT EVENTS: 04/03 - Admit with GI Bleeding & Hyperkalemia  LINES/TUBES: PIV x2 HD cath  4/4  INTERVAL HISTORY:Pt alert. Blood oozing from HD cath site cleaned and redressed. Pt still having blood stools. Pt denies any pain.     VITAL SIGNS: Temp:  [97.5 F (36.4 C)-98.5 F (36.9 C)] 98.5 F (36.9 C) (04/06 0758) Pulse Rate:  [87-114] 99 (04/06 0700) Resp:  [11-21] 11 (04/06 0700) BP: (77-135)/(49-123) 93/67 (04/06 0700) SpO2:  [83 %-100 %] 95 % (04/06 0700) Weight:  [201 lb 4.8 oz (91.3 kg)] 201 lb 4.8 oz (91.3 kg) (04/06 0200)  HEMODYNAMICS:   VENTILATOR SETTINGS:   INTAKE / OUTPUT: Intake/Output      04/05 0701 - 04/06 0700 04/06 0701 - 04/07 0700   P.O. 480    I.V. (mL/kg) 7081.7 (77.6) 660 (7.2)   Blood 300    Total Intake(mL/kg) 7861.7 (86.1) 660 (7.2)   Urine (mL/kg/hr)     Other 6122 (2.8) 577 (1.7)   Stool 0 (0)    Total Output 6122 577   Net +1739.7 +83        Stool Occurrence 7 x     PHYSICAL EXAMINATION: General:  NAD, alert and  interactive Neuro:  RASS 0, moving all ext to command HEENT:  [place]/AT, PERRL, EOM-I and MMM, b/l sclera icterus, rt HD cath site with red clotted blood Cardiovascular:  RRR, no murmurs Lungs:  CTAB, no wheezing or crackles Abdomen:  Distended, palpable mass RUQ and midepigastric region, non TTP Skin:  Warm and dry Ext: trace pedal edema b/l, SCDs on    LABS: Cbc  Recent Labs Lab 12/28/16 0647  12/29/16 0019  12/30/16 0814 12/30/16 0820 12/30/16 0923 12/30/16 0932  WBC 19.2*  --  16.6*  --  16.6*  --   --   --   HGB 6.0*  < > 7.0*  < > 7.6* 8.2* 8.5* 7.5*  HCT 17.0*  < > 19.8*  < > 21.3* 24.0* 25.0* 22.0*  PLT 194  --  150  --  103*  --   --   --   < > = values in this interval not displayed. Chemistry  Recent Labs Lab 12/28/16 [phone]  12/29/16 0445  12/29/16 1546  12/30/16 0453  12/30/16 0820 12/30/16 0923 12/30/16 0932  NA 127*  < > 130*  < > 133*  < > 134*  < > 134* 137 133*  K 7.5*  < > 5.6*  < > 4.9  < > 4.5  < > 4.5 4.2 4.6  CL 94*  < > 91*  < >  92*  < > 89*  < > 89* 92* 89*  CO2 11*  < > 19*  --  23  --  27  --   --   --   --   BUN 180*  < > 134*  < > 96*  < > 54*  < > 45* 29* 42*  CREATININE 10.67*  < > 8.84*  < > 6.63*  < > 4.60*  < > 4.50* 2.50* 4.30*  CALCIUM 7.2*  < > 6.4*  --  7.4*  --  9.9  --   --   --   --   MG 2.5*  --   --   --   --   --  2.0  --   --   --   --   PHOS 11.7*  < > 10.2*  --  7.3*  --  5.4*  --   --   --   --   GLUCOSE 96  < > 96  < > 145*  < > 130*  < > 128* 177* 124*  < > = values in this interval not displayed. Liver fxn  Recent Labs Lab 12/27/16 1927 12/28/16 0647  12/29/16 0445 12/29/16 1546 12/30/16 0453  AST 302* 313*  --   --   --   --   ALT 131* 124*  --   --   --   --   ALKPHOS 1,285* 1,224*  --   --   --   --   BILITOT 16.9* 16.5*  --   --   --   --   PROT 5.6* 4.9*  --   --   --   --   ALBUMIN 1.3* 1.2*  < > <1.0* 1.0* <1.0*  < > = values in this interval not displayed. coags  Recent Labs Lab 12/27/16 1927  12/28/16 0647 12/30/16 0814  APTT  --  40* 36  INR 1.96 1.60 1.34   Sepsis markers  Recent Labs Lab 12/28/16 0647 12/28/16 0944  LATICACIDVEN 3.6* 4.2*   Cardiac markers  Recent Labs Lab 12/28/16 0647 12/28/16 1210 12/28/16 1829  CKTOTAL  --  148  --   TROPONINI 0.09* 0.09* 0.09*   ABG  Recent Labs Lab 12/28/16 1430  12/30/16 0820 12/30/16 0923 12/30/16 0932  PHART 7.505*  --   --   --   --   PCO2ART 22.9*  --   --   --   --   PO2ART 101  --   --   --   --   HCO3 18.0*  --   --   --   --   TCO2  --   < > 32 26 29  < > = values in this interval not displayed.  CBG trend  Recent Labs Lab 12/29/16 1547 12/29/16 1926 12/29/16 2316 12/30/16 0406 12/30/16 0757  GLUCAP 139* 132* 121* 116* 123*     ASSESSMENT / PLAN:   RENAL  ASSESSMENT:   Hyperkalemic, tx with one dose of kayexalate without any BM since admission.  AKI- Cr 10.69 from 1.34 11/10/16 Pseudohypocalcemia - Corrects. Likely due to hypoalbuminemia. Renal u/s 4/4 neg for hydronephrosis  PLAN:  - CRRT per renal, continue through the weekend as not stable enough for intermittent HD at this time  GASTROINTESTINAL  ASSESSMENT:   Acute Upper GI Bleeding - Likely due to peptic ulcer in setting of NSAID use daily vs tumor erosion into small bowel Transaminitis - Worsening.  Known pancreatic cancer with liver mets. Moderate Protein-Calorie Malnutrition  FOBT positive  PLAN:   - Thorazine 25mg  q8h prn for hiccups  - LDH elevated and haptoglobin elevated, unlikely hemolysis, repeat LDH still elevated - SCDs - Protonix  - GI consulted, appreciate their recommendations  HEMATOLOGIC  Acute Blood Loss Anemia - Likely due to upper GI bleed. Coagulopathy - Multifactorial. Pancreatic Cancer with Liver Mets - Follows with Dr. Ber[facility]ice [facility] at [facility]   P:  - Trending Hgb/Hct q4hr - Plan to transfuse for Hgb <7.0 or active bleeding - SCDs - Palliative care following - Percocet q4h prn for  pain  PULMONARY A: No acute issues.  P:   - Monitoring continuous pulse oximetry.  - Monitor for airway protection  CARDIOVASCULAR A:  No acute issues. trops flat  P:  - Monitoring patient on telemetry - TTE yesterday w/ nl EF and grade 1 diastolic dysfunction  INFECTIOUS A:   Leukocytosis - Suspect demargination & increased bone marrow production with blood loss.  P:   - Trending cell counts - Monitoring for signs of infection.   ENDOCRINE A:   Possible Hypoglycemia - Received IV insulin in setting of acute renal failure.    P:   - Monitoring glucose with accu-checks q4hr with MD notification parameters.  NEUROLOGIC A:   No acute issues.  P:   - Monitor closely in ICU.   Julious Oka, MD Internal Medicine Resident, PGY [facility] Internal Medicine Program Pager: [phone]   12/30/2016, 10:38 AM    ATTENDING NOTE: I have personally reviewed patient's available data, including medical history, events of note, physical examination and test results as part of my evaluation. I have discussed with resident/NP and other careteam providers such as pharmacist, RN and RRT & co-ordinated with consultants. In addition, I personally evaluated patient and elicited key history of    56 year old with advanced pancreatic cancer admitted with upper GI bleed and hemorrhagic shock and acute renal failure requiring CRRT. Remains critically ill on CRRT with bleeding from his dialysis catheter  On exam- awake, and warming blanket, interactive, as well as to normal no S3 no rub, decreased breath sounds bilateral, right IJ dialysis catheter saturated with blood, no edema, soft nontender abdomen.   Labs show hemoglobin improved from 6.8-7.6 after transfusion and multiple cytopenia, normal coags, creatinine has decreased to 4.1 on CRRT and BUN to 40  Impression/plan-  Upper GI bleed with hemorrhagic shock- will GI to see and consider EGD , hopeful for something  reversible here  AKI - can transition to intermittent dialysis in my opinion, relate further discussion about goals of care - long-term dialysis certainly not an option here  Advanced pancreatic cancer- agree with palliative care and discussion, ongoing  Right IJ dialysis catheter bleeding- I adjusted catheter and thrombipad Will be placed   Rest per NP/medical resident whose note is outlined above and that I agree with and edited in full.    The patient is critically ill with multiple organ systems failure and requires high complexity decision making for assessment and support, frequent evaluation and titration of therapies, application of advanced monitoring technologies and extensive interpretation of multiple databases. Critical Care Time devoted to patient care services described in this note independent of APP time is 31 minutes.    Rigoberto Noel MD

## 2016-12-30 NOTE — Progress Notes (Signed)
Daily Progress Note   Patient Name: Henry Barton       Date: 12/30/2016 DOB: May 21, 1961  Age: 56 y.o. MRN#: [id number] Attending Physician: Javier Glazier, MD Primary Care Physician: Pcp Not In System Admit Date: 12/27/2016  Reason for Consultation/Follow-up: Establishing goals of care, Non pain symptom management, Pain control and Psychosocial/spiritual support  Subjective: Patient alert and oriented. He has a friend visiting. Chart reviewed. Seen no as needed dosing medicines for pain, nausea. He denies having any pain or nausea. No current hiccups  Length of Stay: 2  Current Medications: Scheduled Meds:  . sodium chloride   Intravenous Once  . midazolam  2 mg Intravenous Once  . ondansetron  4 mg Intravenous Once  . [START ON 12/31/2016] pantoprazole  40 mg Intravenous Q12H    Continuous Infusions: . calcium gluconate infusion for CRRT 20 g (12/30/16 0948)  . norepinephrine (LEVOPHED) Adult infusion Stopped (12/29/16 0800)  . pantoprozole (PROTONIX) infusion 8 mg/hr (12/30/16 0949)  . dialysis replacement fluid (prismasate) 800 mL/hr at 12/30/16 0956  . dialysate (PRISMASATE) 2,000 mL/hr at 12/30/16 0829  . sodium citrate 2 %/dextrose 2.5% solution 3000 mL 400 mL/hr at 12/30/16 0557    PRN Meds: sodium chloride, chlorproMAZINE (THORAZINE) IV, heparin, ondansetron (ZOFRAN) IV, oxyCODONE-acetaminophen, sodium chloride  Physical Exam  Constitutional: He is oriented to person, place, and time.  Cachectic, acutely ill appearing older man  HENT:  Head: Normocephalic and atraumatic.  Neck: Normal range of motion.  Cardiovascular: Normal rate.   Pulmonary/Chest: Effort normal.  Abdominal: He exhibits distension.  Neurological: He is alert and oriented to person, place, and  time.  Skin: Skin is warm and dry.  Psychiatric: He has a normal mood and affect.  Nursing note and vitals reviewed.           Vital Signs: BP 93/67   Pulse 99   Temp 98.5 F (36.9 C) (Oral)   Resp 11   Ht 5\' 10"  (1.778 m)   Wt 91.3 kg (201 lb 4.8 oz)   SpO2 95%   BMI 28.88 kg/m  SpO2: SpO2: 95 % O2 Device: O2 Device: Not Delivered O2 Flow Rate:    Intake/output summary:  Intake/Output Summary (Last 24 hours) at 12/30/16 1100 Last data filed at 12/30/16 1000  Gross per 24 hour  Intake  7201.67 ml  Output             6217 ml  Net           984.67 ml   LBM: Last BM Date: 12/30/16 Baseline Weight: Weight: 78.9 kg (174 lb) Most recent weight: Weight: 91.3 kg (201 lb 4.8 oz)       Palliative Assessment/Data:    Flowsheet Rows     Most Recent Value  Intake Tab  Referral Department  Critical care  Unit at Time of Referral  ICU  Palliative Care Primary Diagnosis  Cancer  Date Notified  12/28/16  Palliative Care Type  New Palliative care  Reason for referral  Clarify Goals of Care  Date of Admission  12/27/16  Date first seen by Palliative Care  12/28/16  # of days Palliative referral response time  0 Day(s)  # of days IP prior to Palliative referral  1  Clinical Assessment  Palliative Performance Scale Score  20%  Psychosocial & Spiritual Assessment  Palliative Care Outcomes      Patient Active Problem List   Diagnosis Date Noted  . DNR (do not resuscitate)   . GI bleed 12/28/2016  . Acute renal failure (ARF) ([place])   . Pancreatic carcinoma metastatic to liver ([place])   . Palliative care by specialist   . DNR (do not resuscitate) discussion     Palliative Care Assessment & Plan   Patient Profile: 56 y.o. male   admitted on 12/27/2016 with  PMH pancreatic cancer with mets to liver, currently undergoing chemotherapy under the care Dr. Ruthe Mannan (diagnosed 11/10/16 by liver biopsy, started chemo 11/29/16). Per Gabriel Rung , NP, conversation with  oncologist, Dr. Berneice Gandy, fear is that tumor is eroding in to GI tract thus contributing to GI bleed.    Assessment: Pt is alert and visiting with a frined. No distress noted  Recommendations/Plan:  Pain continue oxycodone 1-2 tabs every 4 hours as needed  Nausea: Continue Zofran as needed every 6 hours  Hiccups: Continue Thorazine as needed2  Goals of Care and Additional Recommendations:  Limitations on Scope of Treatment: Full Scope Treatment  Code Status:    Code Status Orders        Start     Ordered   12/29/16 1155  Do not attempt resuscitation (DNR)  Continuous    Question Answer Comment  In the event of cardiac or respiratory ARREST Do not call a "code blue"   In the event of cardiac or respiratory ARREST Do not perform Intubation, CPR, defibrillation or ACLS   In the event of cardiac or respiratory ARREST Use medication by any route, position, wound care, and other measures to relive pain and suffering. May use oxygen, suction and manual treatment of airway obstruction as needed for comfort.      12/29/16 1154    Code Status History    Date Active Date Inactive Code Status Order ID Comments User Context   12/29/2016 10:41 AM 12/29/2016 11:54 AM Partial Code [id number]  Rush Farmer, MD Inpatient   12/29/2016 12:16 AM 12/29/2016 10:41 AM Full Code [id number]  Dellia Nims, MD Inpatient   12/28/2016  5:05 PM 12/29/2016 12:16 AM Partial Code [id number]  Knox Royalty, NP Inpatient   12/28/2016  3:29 PM 12/28/2016  5:05 PM DNR [id number]  Rush Farmer, MD Inpatient   12/28/2016 12:33 AM 12/28/2016  3:29 PM Partial Code [id number]  Javier Glazier, MD ED  Prognosis:   Unable to determine but does meet criteria for hospice admission if this were his goal. Patient has metastatic pancreatic cancer with significant liver metastatic disease, acute kidney failure now on hemodialysis, questionable tumor erosion into GI tract causing GI bleed  Discharge Planning:  To Be  Determined    Thank you for allowing the Palliative Medicine Team to assist in the care of this patient.   Time In: 1015 Time Out: 1040 Total Time 25 min Prolonged Time Billed  no       Greater than 50%  of this time was spent counseling and coordinating care related to the above assessment and plan.  Dory Horn, NP  Please contact Palliative Medicine Team phone at [phone] for questions and concerns.

## 2016-12-30 NOTE — Consult Note (Addendum)
Consultation  Referring Provider:  INternal medicine service Primary Care Physician:  Pcp Not In System Primary Gastroenterologist:  none  Reason for Consultation:  GI bleed  HPI: Henry Barton is a 56 y.o. male who was admitted on 12/27/2016 with complaints of weakness. He was hypotensive on admission. He admitted to having dark blackish stools at home for several days. Unfortunately patient was diagnosed with metastatic pancreatic cancer in February 2018 and has been under the care of Dr.Blobe at [facility] since diagnosis. At the time of diagnosis CT scan had shown a large pancreatic head mass and several large right hepatic lobe metastases. He was started on chemotherapy with Xeloda and has also been receiving Decadron and Naprosyn twice daily for pain control. He was last seen at [facility] on 12/20/2016 and at that time had a hemoglobin of 6.2 hematocrit of 19.2. He was transfused 3 units of packed RBCs. Patient says he has been seeing black stools at home ever since with 3-4 bowel movements per day sometimes stools with a reddish tint grossly red bloody stools. Since admission he did have 1 episode of hematemesis with coffee-ground appearing material. He has continued to pass very dark stools and some with more obvious blood.   On admission he was also in acute renal failure with a creatinine above 10. He has been on continuous dialysis, and creatinine today down to 4.6, BUN 54. He is required several units of blood since admission and hemoglobin today is 7.4. Palliative care has been consulted, however patient and family desiring full support at this point.   Past Medical History:  Diagnosis Date  . Cancer [facility])    pancreatic, liver  . Hypertension   . Peptic ulcer disease    Nonbleeding & remote    Past Surgical History:  Procedure Laterality Date  . HERNIA REPAIR     age 65  . PERCUTANEOUS LIVER BIOPSY  10/2016  . VASECTOMY      Prior to Admission medications     Medication Sig Start Date End Date Taking? Authorizing Provider  capecitabine (XELODA) 500 MG tablet Take 1,000 mg by mouth 2 (two) times daily. TAKE 1000MG  BY MOUTH IN AM AND 1000MG  BY MOUTH IN PM. TAKE WITH WATER AFTER MEALS DAYS 1-14 AND STOP ON DAYS 15-21 11/24/16  Yes Historical Provider, MD  CREON 24000-76000 units CPEP Take 2 capsules by mouth 4 (four) times daily. 11/29/16  Yes Historical Provider, MD  dexamethasone (DECADRON) 4 MG tablet Take 8 mg by mouth as directed. Take 8mg  by mouth every morning x 2 days after the first day of each cycle then UTD 11/24/16  Yes Historical Provider, MD  gabapentin (NEURONTIN) 100 MG capsule Take 100 mg by mouth 3 (three) times daily. Hiccups 12/22/16  Yes Historical Provider, MD  lidocaine-prilocaine (EMLA) cream Apply 1 application topically as directed. 11/29/16  Yes Historical Provider, MD  Multiple Vitamin (MULTIVITAMIN) capsule Take 1 capsule by mouth daily.   Yes Historical Provider, MD  naproxen (NAPROSYN) 500 MG tablet Take 500 mg by mouth 2 (two) times daily with a meal.   Yes Historical Provider, MD  ondansetron (ZOFRAN) 8 MG tablet Take 8 mg by mouth every 12 (twelve) hours as needed for nausea/vomiting. 11/24/16  Yes Historical Provider, MD    Current Facility-Administered Medications  Medication Dose Route Frequency Provider Last Rate Last Dose  . 0.9 %  sodium chloride infusion  250 mL Intravenous PRN Javier Glazier, MD 10 mL/hr at 12/30/16 0700  250 mL at 12/30/16 0700  . 0.9 %  sodium chloride infusion   Intravenous Once Ledell Noss, MD      . calcium gluconate 20 g in dextrose 5 % 1,000 mL infusion  20 g CRRT Continuous Mauricia Area, MD 60 mL/hr at 12/30/16 0948 20 g at 12/30/16 0948  . chlorproMAZINE (THORAZINE) 25 mg in sodium chloride 0.9 % 25 mL IVPB  25 mg Intravenous Q8H PRN Norman Herrlich, MD      . heparin injection 1,000-6,000 Units  1,000-6,000 Units CRRT PRN Mauricia Area, MD      . midazolam (VERSED) injection 2 mg  2 mg  Intravenous Once Norman Herrlich, MD      . norepinephrine (LEVOPHED) 4 mg in dextrose 5 % 250 mL (0.016 mg/mL) infusion  0-40 mcg/min Intravenous Titrated Corey Harold, NP   Stopped at 12/29/16 0800  . ondansetron (ZOFRAN) injection 4 mg  4 mg Intravenous Q6H PRN Javier Glazier, MD      . ondansetron [facility]) injection 4 mg  4 mg Intravenous Once Orpah Greek, MD      . oxyCODONE-acetaminophen (PERCOCET/ROXICET) 5-325 MG per tablet 1-2 tablet  1-2 tablet Oral Q4H PRN Norman Herrlich, MD      . pantoprazole (PROTONIX) 80 mg in sodium chloride 0.9 % 250 mL (0.32 mg/mL) infusion  8 mg/hr Intravenous Continuous Javier Glazier, MD 25 mL/hr at 12/30/16 0949 8 mg/hr at 12/30/16 0949  . [START ON 12/31/2016] pantoprazole (PROTONIX) injection 40 mg  40 mg Intravenous Q12H Javier Glazier, MD      . prismasol B22GK 4/0 5,000 mL dialysis replacement fluid   CRRT Continuous Mauricia Area, MD 800 mL/hr at 12/30/16 0956    . prismasol B22GK 4/0 5,000 mL dialysis solution   CRRT Continuous Mauricia Area, MD 2,000 mL/hr at 12/30/16 [phone]    . sodium chloride 0.9 % primer fluid for CRRT   CRRT PRN Mauricia Area, MD      . sodium citrate 2 %/dextrose 2.5% solution 3000 mL   CRRT Continuous Mauricia Area, MD 400 mL/hr at 12/30/16 0557      Allergies as of 12/27/2016  . (No Known Allergies)    Family History  Problem Relation Age of Onset  . Lung cancer Maternal Grandmother     Social History   Social History  . Marital status: Married    Spouse name: N/A  . Number of children: N/A  . Years of education: N/A   Occupational History  . Not on file.   Social History Main Topics  . Smoking status: Never Smoker  . Smokeless tobacco: Never Used  . Alcohol use No  . Drug use: Unknown  . Sexual activity: Not on file   Other Topics Concern  . Not on file   Social History Narrative   [facility] (12/27/16):   Lives with his wife and has been married for 32 years. This  signifies wife is his primary decision maker with his daughter as a Naval architect. Patient has no living will or legal healthcare power of attorney signified.    Review of Systems: Pertinent positive and negative review of systems were noted in the above HPI section.  All other review of systems was otherwise negative.  Physical Exam: Vital signs in last 24 hours: Temp:  [97.5 F (36.4 C)-98.5 F (36.9 C)] 98.5 F (36.9 C) (04/06 0758) Pulse Rate:  [87-114] 99 (04/06 0700) Resp:  [11-21] 11 (04/06  0700) BP: (77-135)/(49-123) 93/67 (04/06 0700) SpO2:  [83 %-100 %] 95 % (04/06 0700) Weight:  [201 lb 4.8 oz (91.3 kg)] 201 lb 4.8 oz (91.3 kg) (04/06 0200) Last BM Date: 12/30/16 General:   Alert,  Well-developed, thin and ill-appearing African-American male, pleasant and cooperative in NAD Head:  Normocephalic and atraumatic. Eyes:  Sclera clear, no icterus.   Conjunctiva pale. Ears:  Normal auditory acuity. Nose:  No deformity, discharge,  or lesions. Mouth:  No deformity or lesions.   Neck:  Supple; no masses or thyromegaly. Lungs:  Clear throughout to auscultation.   No wheezes, crackles, or rhonchi. Heart:  Regular rate and rhythm; no murmurs, clicks, rubs,  or gallops. Abdomen:  Soft,, bowel sounds are present, abdomen is distended but nontender and soft he is tender in the epigastrium over a large hard mass most likely left hepatic lobe. Rectal exam not done  Msk:  Symmetrical without gross deformities. . Pulses:  Normal pulses noted. Extremities:  Without clubbing or edema. Neurologic:  Alert and  oriented x4;  grossly normal neurologically. Skin:  Intact without significant lesions or rashes.. Psych:  Alert and cooperative. Normal mood and affect.  Intake/Output from previous day: 04/05 0701 - 04/06 0700 In: 7861.7 [P.O.:480; I.V.:7081.7; Blood:300] Out: 6122  Intake/Output this shift: Total I/O In: 660 [I.V.:660] Out: 577 [Other:577]  Lab Results:  Recent  Labs  12/28/16 0647  12/29/16 0019  12/30/16 0814 12/30/16 0820 12/30/16 0923 12/30/16 0932  WBC 19.2*  --  16.6*  --  16.6*  --   --   --   HGB 6.0*  < > 7.0*  < > 7.6* 8.2* 8.5* 7.5*  HCT 17.0*  < > 19.8*  < > 21.3* 24.0* 25.0* 22.0*  PLT 194  --  150  --  103*  --   --   --   < > = values in this interval not displayed. BMET  Recent Labs  12/29/16 0445  12/29/16 1546  12/30/16 0453  12/30/16 0820 12/30/16 0923 12/30/16 0932  NA 130*  < > 133*  < > 134*  < > 134* 137 133*  K 5.6*  < > 4.9  < > 4.5  < > 4.5 4.2 4.6  CL 91*  < > 92*  < > 89*  < > 89* 92* 89*  CO2 19*  --  23  --  27  --   --   --   --   GLUCOSE 96  < > 145*  < > 130*  < > 128* 177* 124*  BUN 134*  < > 96*  < > 54*  < > 45* 29* 42*  CREATININE 8.84*  < > 6.63*  < > 4.60*  < > 4.50* 2.50* 4.30*  CALCIUM 6.4*  --  7.4*  --  9.9  --   --   --   --   < > = values in this interval not displayed. LFT  Recent Labs  12/28/16 0647  12/30/16 0453  PROT 4.9*  --   --   ALBUMIN 1.2*  < > <1.0*  AST 313*  --   --   ALT 124*  --   --   ALKPHOS 1,224*  --   --   BILITOT 16.5*  --   --   BILIDIR 9.8*  --   --   IBILI 6.7*  --   --   < > = values in this interval not displayed. PT/INR  Recent Labs  12/28/16 0647 12/30/16 0814  LABPROT 19.3* 16.7*  INR 1.60 1.34   Hepatitis Panel  Recent Labs  12/28/16 1034 12/28/16 1210  HEPBSAG Negative  --   HEPBIGM  --  Negative     IMPRESSION:   #42 56 year old African-American male with metastatic pancreatic cancer diagnosed in February 2018 with large pancreatic head mass and several large right hepatic lobe metastases. He has been followed at [facility] oncology and on Xeloda. #2 acute renal failure-diagnosed on admission, has been on continuous dialysis improvement in creatinine now at 4.6 #3 acute/subacute GI bleed. Patient has been bleeding over the past couple of weeks and continues to have dark and some more obviously bloody stools. Etiology of  bleeding most concerning for tumor erosion into the stomach or small bowel. Also consider NSAID induced peptic ulcer disease, which is felt less likely #4 hypotension-and has required pressor support  Plan; Discussed case with Dr. Silverio Decamp. He has not had any imaging since February and since primary concern is for tumor erosion Will proceed with CT of the abdomen and pelvis initially with oral and no IV contrast. If no obvious tumor erosion then we will proceed to EGD. Continue IV PPI as doing Continue to transfuse to keep hemoglobin above 7.  Please  consult oncology as their expertise will be needed. I discussed plans and concerns with the patient and his family.       Amy Esterwood  12/30/2016, 10:28 AM   Attending physician's note   I have taken a history, examined the patient and reviewed the chart. I agree with the Advanced Practitioner's note, impression and recommendations. 56 year old male with metastatic pancreatic head cancer with extensive liver metastases diagnosed in February 2018 on palliative chemotherapy admitted with symptomatic anemia and melena. He is on CVVHD for worsening renal function with acute renal failure Jaundiced with total bili 16 Exam abdomen is distended and tympanic concerning for gastric outlet obstruction Melena could be secondary to tumor erosion into duodenum Follow-up CT abdomen and pelvis Hold off EGD as less likely to be therapeutic in this scenario and is high risk for perforation with friable necrotic mucosa if there is tumor invasion Severe malnutrition with serum albumin less than 1 Continue supportive care, transfuse as needed Continue PPI If has evidence of gastric outlet obstruction on CT abdomen and pelvis, will need NG tube for gastric decompression Overall poor prognosis   K Denzil Magnuson, MD [phone] Mon-Fri 8a-5p [phone] after 5p, weekends, holidays

## 2016-12-30 NOTE — Care Management Note (Signed)
Case Management Note  Patient Details  Name: Henry Barton MRN: [id number] Date of Birth: [birth year]  Subjective/Objective:   Pt admitted with pancreatic cancer with mets to liver                 Action/Plan:   PTA from home with wife.  Palliative Care working with family on [place] as prognosis is very poor.  CM will continue to follow for discharge needs   Expected Discharge Date:   (unknown)               Expected Discharge Plan:     In-House Referral:     Discharge planning Services  CM Consult  Post Acute Care Choice:    Choice offered to:     DME Arranged:    DME Agency:     HH Arranged:    HH Agency:     Status of Service:     If discussed at [employer], dates discussed:    Additional Comments:  Maryclare Labrador, RN 12/30/2016, 2:33 PM

## 2016-12-31 DIAGNOSIS — C259 Malignant neoplasm of pancreas, unspecified: Secondary | ICD-10-CM

## 2016-12-31 DIAGNOSIS — D500 Iron deficiency anemia secondary to blood loss (chronic): Secondary | ICD-10-CM

## 2016-12-31 LAB — POCT I-STAT, CHEM 8
BUN: 19 mg/dL (ref 6–20)
BUN: 20 mg/dL (ref 6–20)
BUN: 21 mg/dL — AB (ref 6–20)
BUN: 23 mg/dL — ABNORMAL HIGH (ref 6–20)
BUN: 24 mg/dL — AB (ref 6–20)
BUN: 26 mg/dL — ABNORMAL HIGH (ref 6–20)
BUN: 26 mg/dL — ABNORMAL HIGH (ref 6–20)
BUN: 27 mg/dL — ABNORMAL HIGH (ref 6–20)
BUN: 27 mg/dL — ABNORMAL HIGH (ref 6–20)
BUN: 28 mg/dL — AB (ref 6–20)
BUN: 29 mg/dL — AB (ref 6–20)
BUN: 32 mg/dL — AB (ref 6–20)
BUN: 32 mg/dL — ABNORMAL HIGH (ref 6–20)
BUN: 33 mg/dL — ABNORMAL HIGH (ref 6–20)
BUN: 38 mg/dL — ABNORMAL HIGH (ref 6–20)
CALCIUM ION: 0.49 mmol/L — AB (ref 1.15–1.40)
CALCIUM ION: 0.52 mmol/L — AB (ref 1.15–1.40)
CALCIUM ION: 0.53 mmol/L — AB (ref 1.15–1.40)
CALCIUM ION: 1.2 mmol/L (ref 1.15–1.40)
CALCIUM ION: 1.26 mmol/L (ref 1.15–1.40)
CALCIUM ION: 1.4 mmol/L (ref 1.15–1.40)
CHLORIDE: 88 mmol/L — AB (ref 101–111)
CHLORIDE: 88 mmol/L — AB (ref 101–111)
CHLORIDE: 84 mmol/L — AB (ref 101–111)
CHLORIDE: 86 mmol/L — AB (ref 101–111)
CHLORIDE: 85 mmol/L — AB (ref 101–111)
CHLORIDE: 88 mmol/L — AB (ref 101–111)
CHLORIDE: 88 mmol/L — AB (ref 101–111)
CHLORIDE: 91 mmol/L — AB (ref 101–111)
CREATININE: 2.7 mg/dL — AB (ref 0.61–1.24)
CREATININE: 3 mg/dL — AB (ref 0.61–1.24)
CREATININE: 3.3 mg/dL — AB (ref 0.61–1.24)
CREATININE: 3.4 mg/dL — AB (ref 0.61–1.24)
CREATININE: 3.8 mg/dL — AB (ref 0.61–1.24)
CREATININE: 3.9 mg/dL — AB (ref 0.61–1.24)
Calcium, Ion: 0.5 mmol/L — CL (ref 1.15–1.40)
Calcium, Ion: 0.52 mmol/L — CL (ref 1.15–1.40)
Calcium, Ion: 0.77 mmol/L — CL (ref 1.15–1.40)
Calcium, Ion: 1.22 mmol/L (ref 1.15–1.40)
Calcium, Ion: 1.32 mmol/L (ref 1.15–1.40)
Calcium, Ion: 0.85 mmol/L — CL (ref 1.15–1.40)
Calcium, Ion: 1.13 mmol/L — ABNORMAL LOW (ref 1.15–1.40)
Calcium, Ion: 0.64 mmol/L — CL (ref 1.15–1.40)
Calcium, Ion: 1.15 mmol/L (ref 1.15–1.40)
Chloride: 88 mmol/L — ABNORMAL LOW (ref 101–111)
Chloride: 89 mmol/L — ABNORMAL LOW (ref 101–111)
Chloride: 90 mmol/L — ABNORMAL LOW (ref 101–111)
Chloride: 89 mmol/L — ABNORMAL LOW (ref 101–111)
Chloride: 98 mmol/L — ABNORMAL LOW (ref 101–111)
Chloride: 91 mmol/L — ABNORMAL LOW (ref 101–111)
Chloride: 91 mmol/L — ABNORMAL LOW (ref 101–111)
Creatinine, Ser: 3.5 mg/dL — ABNORMAL HIGH (ref 0.61–1.24)
Creatinine, Ser: 3.1 mg/dL — ABNORMAL HIGH (ref 0.61–1.24)
Creatinine, Ser: 3.3 mg/dL — ABNORMAL HIGH (ref 0.61–1.24)
Creatinine, Ser: 3.7 mg/dL — ABNORMAL HIGH (ref 0.61–1.24)
Creatinine, Ser: 3.2 mg/dL — ABNORMAL HIGH (ref 0.61–1.24)
Creatinine, Ser: 3.5 mg/dL — ABNORMAL HIGH (ref 0.61–1.24)
Creatinine, Ser: 3.1 mg/dL — ABNORMAL HIGH (ref 0.61–1.24)
Creatinine, Ser: 3.5 mg/dL — ABNORMAL HIGH (ref 0.61–1.24)
Creatinine, Ser: 3 mg/dL — ABNORMAL HIGH (ref 0.61–1.24)
GLUCOSE: 133 mg/dL — AB (ref 65–99)
GLUCOSE: 166 mg/dL — AB (ref 65–99)
GLUCOSE: 177 mg/dL — AB (ref 65–99)
GLUCOSE: 195 mg/dL — AB (ref 65–99)
Glucose, Bld: 135 mg/dL — ABNORMAL HIGH (ref 65–99)
Glucose, Bld: 136 mg/dL — ABNORMAL HIGH (ref 65–99)
Glucose, Bld: 139 mg/dL — ABNORMAL HIGH (ref 65–99)
Glucose, Bld: 190 mg/dL — ABNORMAL HIGH (ref 65–99)
Glucose, Bld: 195 mg/dL — ABNORMAL HIGH (ref 65–99)
Glucose, Bld: 204 mg/dL — ABNORMAL HIGH (ref 65–99)
Glucose, Bld: 205 mg/dL — ABNORMAL HIGH (ref 65–99)
Glucose, Bld: 140 mg/dL — ABNORMAL HIGH (ref 65–99)
Glucose, Bld: 100 mg/dL — ABNORMAL HIGH (ref 65–99)
Glucose, Bld: 150 mg/dL — ABNORMAL HIGH (ref 65–99)
Glucose, Bld: 139 mg/dL — ABNORMAL HIGH (ref 65–99)
HCT: 21 % — ABNORMAL LOW (ref 39.0–52.0)
HCT: 21 % — ABNORMAL LOW (ref 39.0–52.0)
HCT: 21 % — ABNORMAL LOW (ref 39.0–52.0)
HCT: 24 % — ABNORMAL LOW (ref 39.0–52.0)
HCT: 24 % — ABNORMAL LOW (ref 39.0–52.0)
HCT: 26 % — ABNORMAL LOW (ref 39.0–52.0)
HCT: 20 % — ABNORMAL LOW (ref 39.0–52.0)
HCT: 26 % — ABNORMAL LOW (ref 39.0–52.0)
HEMATOCRIT: 24 % — AB (ref 39.0–52.0)
HEMATOCRIT: 24 % — AB (ref 39.0–52.0)
HEMATOCRIT: 20 % — AB (ref 39.0–52.0)
HEMATOCRIT: 20 % — AB (ref 39.0–52.0)
HEMATOCRIT: 23 % — AB (ref 39.0–52.0)
HEMATOCRIT: 25 % — AB (ref 39.0–52.0)
HEMATOCRIT: 25 % — AB (ref 39.0–52.0)
HEMOGLOBIN: 6.8 g/dL — AB (ref 13.0–17.0)
HEMOGLOBIN: 7.8 g/dL — AB (ref 13.0–17.0)
HEMOGLOBIN: 6.8 g/dL — AB (ref 13.0–17.0)
HEMOGLOBIN: 8.5 g/dL — AB (ref 13.0–17.0)
HEMOGLOBIN: 8.8 g/dL — AB (ref 13.0–17.0)
Hemoglobin: 6.8 g/dL — CL (ref 13.0–17.0)
Hemoglobin: 7.1 g/dL — ABNORMAL LOW (ref 13.0–17.0)
Hemoglobin: 7.1 g/dL — ABNORMAL LOW (ref 13.0–17.0)
Hemoglobin: 7.1 g/dL — ABNORMAL LOW (ref 13.0–17.0)
Hemoglobin: 8.2 g/dL — ABNORMAL LOW (ref 13.0–17.0)
Hemoglobin: 8.2 g/dL — ABNORMAL LOW (ref 13.0–17.0)
Hemoglobin: 8.2 g/dL — ABNORMAL LOW (ref 13.0–17.0)
Hemoglobin: 8.2 g/dL — ABNORMAL LOW (ref 13.0–17.0)
Hemoglobin: 8.8 g/dL — ABNORMAL LOW (ref 13.0–17.0)
Hemoglobin: 8.5 g/dL — ABNORMAL LOW (ref 13.0–17.0)
POTASSIUM: 4.2 mmol/L (ref 3.5–5.1)
POTASSIUM: 4.4 mmol/L (ref 3.5–5.1)
POTASSIUM: 3.7 mmol/L (ref 3.5–5.1)
Potassium: 4.1 mmol/L (ref 3.5–5.1)
Potassium: 4.2 mmol/L (ref 3.5–5.1)
Potassium: 4.3 mmol/L (ref 3.5–5.1)
Potassium: 4.3 mmol/L (ref 3.5–5.1)
Potassium: 4.3 mmol/L (ref 3.5–5.1)
Potassium: 4.4 mmol/L (ref 3.5–5.1)
Potassium: 4.5 mmol/L (ref 3.5–5.1)
Potassium: 4.7 mmol/L (ref 3.5–5.1)
Potassium: 4.4 mmol/L (ref 3.5–5.1)
Potassium: 4.5 mmol/L (ref 3.5–5.1)
Potassium: 4.3 mmol/L (ref 3.5–5.1)
Potassium: 4.4 mmol/L (ref 3.5–5.1)
SODIUM: 133 mmol/L — AB (ref 135–145)
SODIUM: 132 mmol/L — AB (ref 135–145)
SODIUM: 133 mmol/L — AB (ref 135–145)
SODIUM: 132 mmol/L — AB (ref 135–145)
SODIUM: 134 mmol/L — AB (ref 135–145)
SODIUM: 137 mmol/L (ref 135–145)
SODIUM: 135 mmol/L (ref 135–145)
Sodium: 131 mmol/L — ABNORMAL LOW (ref 135–145)
Sodium: 131 mmol/L — ABNORMAL LOW (ref 135–145)
Sodium: 133 mmol/L — ABNORMAL LOW (ref 135–145)
Sodium: 133 mmol/L — ABNORMAL LOW (ref 135–145)
Sodium: 133 mmol/L — ABNORMAL LOW (ref 135–145)
Sodium: 133 mmol/L — ABNORMAL LOW (ref 135–145)
Sodium: 135 mmol/L (ref 135–145)
Sodium: 133 mmol/L — ABNORMAL LOW (ref 135–145)
TCO2: 25 mmol/L (ref 0–100)
TCO2: 27 mmol/L (ref 0–100)
TCO2: 28 mmol/L (ref 0–100)
TCO2: 28 mmol/L (ref 0–100)
TCO2: 29 mmol/L (ref 0–100)
TCO2: 29 mmol/L (ref 0–100)
TCO2: 29 mmol/L (ref 0–100)
TCO2: 29 mmol/L (ref 0–100)
TCO2: 29 mmol/L (ref 0–100)
TCO2: 30 mmol/L (ref 0–100)
TCO2: 30 mmol/L (ref 0–100)
TCO2: 30 mmol/L (ref 0–100)
TCO2: 30 mmol/L (ref 0–100)
TCO2: 30 mmol/L (ref 0–100)
TCO2: 31 mmol/L (ref 0–100)

## 2016-12-31 LAB — CBC
HCT: 20 % — ABNORMAL LOW (ref 39.0–52.0)
HCT: 20.6 % — ABNORMAL LOW (ref 39.0–52.0)
HEMOGLOBIN: 7 g/dL — AB (ref 13.0–17.0)
HEMOGLOBIN: 7.3 g/dL — AB (ref 13.0–17.0)
MCH: 28.8 pg (ref 26.0–34.0)
MCH: 29.7 pg (ref 26.0–34.0)
MCHC: 35 g/dL (ref 30.0–36.0)
MCHC: 35.4 g/dL (ref 30.0–36.0)
MCV: 82.3 fL (ref 78.0–100.0)
MCV: 83.7 fL (ref 78.0–100.0)
Platelets: 97 10*3/uL — ABNORMAL LOW (ref 150–400)
Platelets: 112 10*3/uL — ABNORMAL LOW (ref 150–400)
RBC: 2.43 MIL/uL — ABNORMAL LOW (ref 4.22–5.81)
RBC: 2.46 MIL/uL — AB (ref 4.22–5.81)
RDW: 15.8 % — ABNORMAL HIGH (ref 11.5–15.5)
RDW: 16.5 % — ABNORMAL HIGH (ref 11.5–15.5)
WBC: 15.9 10*3/uL — AB (ref 4.0–10.5)
WBC: 17 10*3/uL — AB (ref 4.0–10.5)

## 2016-12-31 LAB — RENAL FUNCTION PANEL
ANION GAP: 15 (ref 5–15)
Anion gap: 19 — ABNORMAL HIGH (ref 5–15)
BUN: 32 mg/dL — AB (ref 6–20)
BUN: 24 mg/dL — AB (ref 6–20)
CALCIUM: 9.6 mg/dL (ref 8.9–10.3)
CHLORIDE: 89 mmol/L — AB (ref 101–111)
CO2: 26 mmol/L (ref 22–32)
CO2: 28 mmol/L (ref 22–32)
Calcium: 11.3 mg/dL — ABNORMAL HIGH (ref 8.9–10.3)
Chloride: 92 mmol/L — ABNORMAL LOW (ref 101–111)
Creatinine, Ser: 3.58 mg/dL — ABNORMAL HIGH (ref 0.61–1.24)
Creatinine, Ser: 3.26 mg/dL — ABNORMAL HIGH (ref 0.61–1.24)
GFR calc Af Amer: 21 mL/min — ABNORMAL LOW (ref >60)
GFR calc Af Amer: 23 mL/min — ABNORMAL LOW (ref >60)
GFR calc non Af Amer: 18 mL/min — ABNORMAL LOW (ref >60)
GFR, EST NON AFRICAN AMERICAN: 20 mL/min — AB (ref >60)
GLUCOSE: 133 mg/dL — AB (ref 65–99)
GLUCOSE: 135 mg/dL — AB (ref 65–99)
PHOSPHORUS: 4.7 mg/dL — AB (ref 2.5–4.6)
PHOSPHORUS: 3.8 mg/dL (ref 2.5–4.6)
POTASSIUM: 4.5 mmol/L (ref 3.5–5.1)
Potassium: 4.5 mmol/L (ref 3.5–5.1)
SODIUM: 134 mmol/L — AB (ref 135–145)
SODIUM: 135 mmol/L (ref 135–145)

## 2016-12-31 LAB — CALCIUM, IONIZED: CALCIUM, IONIZED, SERUM: 5.2 mg/dL (ref 4.5–5.6)

## 2016-12-31 LAB — GLUCOSE, CAPILLARY: Glucose-Capillary: 124 mg/dL — ABNORMAL HIGH (ref 65–99)

## 2016-12-31 LAB — MAGNESIUM: MAGNESIUM: 1.8 mg/dL (ref 1.7–2.4)

## 2016-12-31 MED ORDER — WHITE PETROLATUM GEL
Status: AC
  Administered 2016-12-31: 17:00:00
  Filled 2016-12-31: qty 1

## 2016-12-31 MED ORDER — SIMETHICONE 80 MG PO CHEW
80.0000 mg | CHEWABLE_TABLET | Freq: Four times a day (QID) | ORAL | Status: DC | PRN
  Administered 2016-12-31 – 2017-01-14 (×21): 80 mg via ORAL
  Filled 2016-12-31 (×22): qty 1

## 2016-12-31 NOTE — Progress Notes (Signed)
PULMONARY  / CRITICAL CARE MEDICINE  Name: Henry Barton MRN: [id number] DOB: 1961/07/24    LOS: 23  REFERRING MD :  Winfred Leeds  CHIEF COMPLAINT:  weakness  BRIEF PATIENT DESCRIPTION: Henry Barton is a 56 y.o. male with PMH as outlined below including pancreatic cancer with mets to liver, currently undergoing chemotherapy under the care Dr. Berneice Gandy at [facility] (diagnosed 11/10/16 by liver biopsy, started chemo 11/29/16). He presented to [facility] ED on 4/3 with weakness and hypotension. Symptoms began around noon that afternoon. He had also been having dark bloody stools that started 1 day prior along with intermittent lightheadedness. hgb 4.3, FOBT positive, K >7.5 in   STUDIES:  LIVER BIOPSY 11/10/16:  Metastatic adenocarcinoma consistent with pancreatic primary. EKG 4/3:  Personally reviewed by me. Borderline QRS complex widening. Some peaking of the T waves. QTc prolonged.  MICROBIOLOGY: MRSA (-)  ANTIBIOTICS: None.  SIGNIFICANT EVENTS: 04/03 - Admit with GI Bleeding & Hyperkalemia  LINES/TUBES: PIV x2 HD cath  4/4  SUBJECTIVE: Pt on CVVH, tolerating it. BP soft.  No further bleeding.  Comfortable.     VITAL SIGNS: Temp:  [97.9 F (36.6 C)-99.2 F (37.3 C)] 99.2 F (37.3 C) (04/07 0837) Pulse Rate:  [91-110] 106 (04/07 0700) Resp:  [9-24] 14 (04/07 0700) BP: (87-149)/(55-86) 97/60 (04/07 0700) SpO2:  [88 %-100 %] 100 % (04/07 0700) Weight:  [86.8 kg (191 lb 5.8 oz)] 86.8 kg (191 lb 5.8 oz) (04/07 0500)  HEMODYNAMICS:   VENTILATOR SETTINGS:   INTAKE / OUTPUT: Intake/Output      04/06 0701 - 04/07 0700 04/07 0701 - 04/08 0700   P.O. 740    I.V. (mL/kg) 5062 (58.3)    Blood     Total Intake(mL/kg) 5802 (66.8)    Other 5364 1079   Stool 1    Total Output 5365 1079   Net +437 -1079         PHYSICAL EXAMINATION: General:  NAD, alert and interactive Neuro:  RASS 0, moving all ext to command HEENT:  [place]/AT, PERRL, EOM-I and MMM, b/l sclera icterus, rt HD cath site   Cardiovascular:  RRR, no murmurs Lungs:  CTAB, no wheezing or crackles Abdomen:  Distended, palpable mass RUQ and midepigastric region, non TTP Skin:  Warm and dry Ext: trace pedal edema b/l, SCDs on    LABS: Cbc  Recent Labs Lab 12/29/16 0019  12/30/16 0814  12/31/16 0346  12/31/16 0645 12/31/16 1124 12/31/16 1132  WBC 16.6*  --  16.6*  --  15.9*  --   --   --   --   HGB 7.0*  < > 7.6*  < > 7.0*  < > 6.8* 8.8* 7.8*  HCT 19.8*  < > 21.3*  < > 20.0*  < > 20.0* 26.0* 23.0*  PLT 150  --  103*  --  97*  --   --   --   --   < > = values in this interval not displayed. Chemistry  Recent Labs Lab 12/28/16 [phone]  12/30/16 0453  12/30/16 1642  12/31/16 0346  12/31/16 0645 12/31/16 1124 12/31/16 1132  NA 127*  < > 134*  < > 133*  < > 134*  < > 132* 133* 132*  K 7.5*  < > 4.5  < > 5.0  < > 4.5  < > 4.3 4.3 4.4  CL 94*  < > 89*  < > 90*  < > 89*  < > 88* 88* 89*  CO2 11*  < > 27  --  28  --  26  --   --   --   --   BUN 180*  < > 54*  < > 43*  < > 32*  < > 27* 24* 26*  CREATININE 10.67*  < > 4.60*  < > 4.21*  < > 3.58*  < > 3.50* 3.10* 3.50*  CALCIUM 7.2*  < > 9.9  --  10.4*  --  11.3*  --   --   --   --   MG 2.5*  --  2.0  --   --   --  1.8  --   --   --   --   PHOS 11.7*  < > 5.4*  --  5.6*  --  4.7*  --   --   --   --   GLUCOSE 96  < > 130*  < > 84  < > 133*  < > 135* 177* 140*  < > = values in this interval not displayed. Liver fxn  Recent Labs Lab 12/27/16 1927 12/28/16 0647  12/30/16 0453 12/30/16 1642 12/31/16 0346  AST 302* 313*  --   --   --   --   ALT 131* 124*  --   --   --   --   ALKPHOS 1,285* 1,224*  --   --   --   --   BILITOT 16.9* 16.5*  --   --   --   --   PROT 5.6* 4.9*  --   --   --   --   ALBUMIN 1.3* 1.2*  < > <1.0* 1.0* <1.0*  < > = values in this interval not displayed. coags  Recent Labs Lab 12/27/16 1927 12/28/16 0647 12/30/16 0814  APTT  --  40* 36  INR 1.96 1.60 1.34   Sepsis markers  Recent Labs Lab 12/28/16 0647  12/28/16 0944  LATICACIDVEN 3.6* 4.2*   Cardiac markers  Recent Labs Lab 12/28/16 0647 12/28/16 1210 12/28/16 1829  CKTOTAL  --  148  --   TROPONINI 0.09* 0.09* 0.09*   ABG  Recent Labs Lab 12/28/16 1430  12/31/16 0645 12/31/16 1124 12/31/16 1132  PHART 7.505*  --   --   --   --   PCO2ART 22.9*  --   --   --   --   PO2ART 101  --   --   --   --   HCO3 18.0*  --   --   --   --   TCO2  --   < > 31 30 30   < > = values in this interval not displayed.  CBG trend  Recent Labs Lab 12/30/16 1214 12/30/16 1553 12/30/16 1954 12/30/16 2343 12/31/16 0333  GLUCAP 144* 92 128* 128* 124*     ASSESSMENT / PLAN:   RENAL ASSESSMENT:   AKI PLAN:  - CRRT per renal, continue through the weekend as not stable enough for intermittent HD at this time - anticipate HD next week   GASTROINTESTINAL ASSESSMENT:   Acute Upper GI Bleeding - Likely related to metastatic pancreatic CA +/- PUD Metastatic pancreatic CA Transaminitis Moderate Protein-Calorie Malnutrition PLAN:   -  Appreciate GI recommendations.  Conservative management for now.  Risks likely outweigh benefits of procedure.  - Hb and Hct this pm.  Transfuse for Hb < 7 - Thorazine 25mg  q8h prn for hiccups  - SCDs - Protonix IV  HEMATOLOGIC Acute Blood Loss Anemia 2/2 PUD + Tumor invasion Coagulopathy - Multifactorial. Pancreatic Cancer with Liver Mets - Follows with Dr. Ber[facility]ice [facility] at [facility].  P:  - Trending Hgb/Hct q12 hrs for now.  - holding off on EGD - Plan to transfuse for Hgb <7.0 or active bleeding - SCDs - Palliative care following - Percocet q4h prn for pain   PULMONARY A: No acute issues. P:   - Monitoring continuous pulse oximetry.  - Monitor for airway protection   CARDIOVASCULAR A:  No acute issues. CHFpEF P:  - Monitoring patient on telemetry   INFECTIOUS A:   Leukocytosis - Suspect demargination & increased bone marrow production with blood loss. P:   - Trending cell counts -  Monitoring for signs of infection.    ENDOCRINE A:   No issues. S/P hypoglycemia P:   - Monitoring glucose with accu-checks q4hr with MD notification parameters.   NEUROLOGIC A:   Depression Chronic pain related to CA  P:   - Monitor closely in ICU.    Family wants to see how the patient does over the weekend. Palliative care is following. I imagine, we'll transition to comfort care or discharged on hospice next week. Keep in ICU for CVVH.   Monica Becton, MD 12/31/2016, 1:24 PM [facility] Pager [phone] After 3 pm or if no answer, call [phone]

## 2016-12-31 NOTE — Progress Notes (Signed)
[facility] ROUNDING NOTE   Subjective: Noncontrast CT abdomen and pelvis showed high tumor burden in the liver and no significant change in pancreatic head lesion.  He remains on CVVHD Hemoglobin slightly lower, trended down to 7  Objective: Vital signs in last 24 hours: Temp:  [97.9 F (36.6 C)-99.2 F (37.3 C)] 99.2 F (37.3 C) (04/07 0837) Pulse Rate:  [91-110] 106 (04/07 0700) Resp:  [9-24] 14 (04/07 0700) BP: (87-149)/(53-86) 97/60 (04/07 0700) SpO2:  [88 %-100 %] 100 % (04/07 0700) Weight:  [191 lb 5.8 oz (86.8 kg)] 191 lb 5.8 oz (86.8 kg) (04/07 0500) Last BM Date: 12/31/16 General: NAD, cachectic appearing, icteric Heart: s1s2 Abdomen: Distended, tympanic, mild generalized tenderness      Intake/Output from previous day: 04/06 0701 - 04/07 0700 In: 5802 [P.O.:740; I.V.:5062] Out: 5365 [Stool:1] Intake/Output this shift: Total I/O In: -  Out: 549 [Other:549]   Lab Results:  Recent Labs  12/29/16 0019  12/30/16 0814  12/30/16 1317 12/30/16 1931 12/31/16 0346  WBC 16.6*  --  16.6*  --   --   --  15.9*  HGB 7.0*  < > 7.6*  < > 7.5* 8.2* 7.0*  PLT 150  --  103*  --   --   --  97*  MCV 81.1  --  80.7  --   --   --  82.3  < > = values in this interval not displayed. BMET  Recent Labs  12/30/16 0453  12/30/16 1642 12/30/16 1931 12/31/16 0346  NA 134*  < > 133* 134* 134*  K 4.5  < > 5.0 4.5 4.5  CL 89*  < > 90* 89* 89*  CO2 27  --  28  --  26  GLUCOSE 130*  < > 84 188* 133*  BUN 54*  < > 43* 32* 32*  CREATININE 4.60*  < > 4.21* 3.40* 3.58*  CALCIUM 9.9  --  10.4*  --  11.3*  < > = values in this interval not displayed. LFT  Recent Labs  12/30/16 0453 12/30/16 1642 12/31/16 0346  ALBUMIN <1.0* 1.0* <1.0*   PT/INR  Recent Labs  12/30/16 0814  INR 1.34      Imaging/Other results: Ct Abdomen Pelvis Wo Contrast  Result Date: 12/30/2016 CLINICAL DATA:  GI bleed.  Is pancreatic cancer. EXAM: CT ABDOMEN AND PELVIS WITHOUT  CONTRAST TECHNIQUE: Multidetector CT imaging of the abdomen and pelvis was performed following the standard protocol without IV contrast. COMPARISON:  Abdomen CT 11/02/2016 FINDINGS: Lower chest: Subsegmental atelectasis noted right lower lobe. 5 mm perifissural nodule seen in the right lower lung on image 1 series 4. Hepatobiliary: Bulky liver lesions are again noted, poorly defined given the lack of intravenous contrast material. Some of the lesions probably measure up to 7-8 cm. Lesions appear confluent in some areas. Gallbladder not visualized. Insert no biliary Pancreas: Pancreatic head mass seen previously at 4.0 x 2.9 cm now measures 3.8 x 3.1 cm Spleen: No splenomegaly. No focal mass lesion. Adrenals/Urinary Tract: No adrenal nodule or mass. No hydronephrosis in either kidney. Bladder moderately distended. Stomach/Bowel: Stomach is nondistended. No evidence for small bowel obstruction. No colonic obstruction. Vascular/Lymphatic: No abdominal aortic aneurysm. No definite lymphadenopathy in the abdomen or pelvis. Reproductive: The prostate gland and seminal vesicles have normal imaging features. Other: Small to moderate volume intraperitoneal free fluid is evident. Musculoskeletal: Diffuse body wall and mesenteric edema is noted. Bone windows reveal no worrisome lytic or sclerotic osseous lesions. IMPRESSION:  Limited study due to lack of intravenous contrast and cachexia. Bulky hepatic metastases with no substantial change in size of the pancreatic head mass. Small moderate volume ascites with diffuse body wall edema. Electronically Signed   By: Misty Stanley M.D.   On: 12/30/2016 15:33      Assessment &Plan  56 year old male with metastatic pancreatic cancer with extensive liver metastases and tumor burden, was receiving palliative chemotherapy  [facility] at [facility] admitted with acute renal failure   CT abdomen and pelvis showed high tumor burden   He has ongoing transfusion requirement with down trending  hemoglobin , anemia is likely multifactorial in addition to GI blood loss   EGD is less likely to be therapeutic in this scenario, I do not recommend diagnostic EGD due to increased risk related to sedation and procedure given his co-morbidities, electrolyte disturbances, he also likely has friable duodenal mucosa due to tumor erosion and also vascular compromise with metastatic lesions encasing major vessels SMA/SMV. Though not clearly visualized on CT abdomen and pelvis due to lack of IV contrast. Poor nutritional status with severe malnutrition, serum albumin is less than 1 Continue PPI twice daily Diet as tolerated Overall poor prognosis Palliative care team is following patient Please call with any questions >35 minutes was spent face-to-face with the patient and family. Greater than 50% of the time used for counseling regarding metastatic pancreatic cancer, etiology of GI bleed, anemia, prognosis as well as treatment plan and follow-up. He and family had multiple questions which were answered to their satisfaction   K. Denzil Magnuson , MD [phone] Mon-Fri 8a-5p [phone] after 5p, weekends, holidays [facility]

## 2016-12-31 NOTE — Progress Notes (Signed)
Subjective: Interval History: has no complaint .  Objective: Vital signs in last 24 hours: Temp:  [97.9 F (36.6 C)-98.5 F (36.9 C)] 97.9 F (36.6 C) (04/07 0334) Pulse Rate:  [91-110] 106 (04/07 0700) Resp:  [9-24] 14 (04/07 0700) BP: (82-149)/(53-86) 97/60 (04/07 0700) SpO2:  [88 %-100 %] 100 % (04/07 0700) Weight:  [86.8 kg (191 lb 5.8 oz)] 86.8 kg (191 lb 5.8 oz) (04/07 0500) Weight change: -4.509 kg (-9 lb 15.1 oz)  Intake/Output from previous day: 04/06 0701 - 04/07 0700 In: 5802 [P.O.:740; I.V.:5062] Out: 5365 [Stool:1] Intake/Output this shift: No intake/output data recorded.  General appearance: cooperative, slowed mentation, toxic and asterixis Neck: IJ cath on R Resp: rales bibasilar and rhonchi bibasilar Cardio: S1, S2 normal GI: distended, liver down 10cm and hard Extremities: edema 2+presacral  Lab Results:  Recent Labs  12/30/16 0814  12/30/16 1931 12/31/16 0346  WBC 16.6*  --   --  15.9*  HGB 7.6*  < > 8.2* 7.0*  HCT 21.3*  < > 24.0* 20.0*  PLT 103*  --   --  97*  < > = values in this interval not displayed. BMET:  Recent Labs  12/30/16 1642 12/30/16 1931 12/31/16 0346  NA 133* 134* 134*  K 5.0 4.5 4.5  CL 90* 89* 89*  CO2 28  --  26  GLUCOSE 84 188* 133*  BUN 43* 32* 32*  CREATININE 4.21* 3.40* 3.58*  CALCIUM 10.4*  --  11.3*   No results for input(s): PTH in the last 72 hours. Iron Studies:  Recent Labs  12/28/16 1210  IRON 71  TIBC 207*    Studies/Results: Ct Abdomen Pelvis Wo Contrast  Result Date: 12/30/2016 CLINICAL DATA:  GI bleed.  Is pancreatic cancer. EXAM: CT ABDOMEN AND PELVIS WITHOUT CONTRAST TECHNIQUE: Multidetector CT imaging of the abdomen and pelvis was performed following the standard protocol without IV contrast. COMPARISON:  Abdomen CT 11/02/2016 FINDINGS: Lower chest: Subsegmental atelectasis noted right lower lobe. 5 mm perifissural nodule seen in the right lower lung on image 1 series 4. Hepatobiliary: Bulky  liver lesions are again noted, poorly defined given the lack of intravenous contrast material. Some of the lesions probably measure up to 7-8 cm. Lesions appear confluent in some areas. Gallbladder not visualized. Insert no biliary Pancreas: Pancreatic head mass seen previously at 4.0 x 2.9 cm now measures 3.8 x 3.1 cm Spleen: No splenomegaly. No focal mass lesion. Adrenals/Urinary Tract: No adrenal nodule or mass. No hydronephrosis in either kidney. Bladder moderately distended. Stomach/Bowel: Stomach is nondistended. No evidence for small bowel obstruction. No colonic obstruction. Vascular/Lymphatic: No abdominal aortic aneurysm. No definite lymphadenopathy in the abdomen or pelvis. Reproductive: The prostate gland and seminal vesicles have normal imaging features. Other: Small to moderate volume intraperitoneal free fluid is evident. Musculoskeletal: Diffuse body wall and mesenteric edema is noted. Bone windows reveal no worrisome lytic or sclerotic osseous lesions. IMPRESSION: Limited study due to lack of intravenous contrast and cachexia. Bulky hepatic metastases with no substantial change in size of the pancreatic head mass. Small moderate volume ascites with diffuse body wall edema. Electronically Signed   By: Misty Stanley M.D.   On: 12/30/2016 15:33    I have reviewed the patient's current medications.  Assessment/Plan: 1 AKI oliguric ATN vol xs some resp compromise.  ^ net neg. Acid/base/K ok. Ca ^, lower infusion 2 Anemia lower 3 Metastatic Ca prognosis nil 4 GIB ?? Source Hb down P ^ net neg lower Ca infusion,  LOS: 3 days   Caron Tardif L 12/31/2016,7:32 AM

## 2016-12-31 NOTE — Progress Notes (Signed)
eLink Physician-Brief Progress Note Patient Name: Henry Barton DOB: 1961-03-27 MRN: [id number]   Date of Service  12/31/2016  HPI/Events of Note  Patient c/o abdominal "gas".  eICU Interventions  Will order: 1. Simethicone 80 mg PO Q 6 hours PRN gas or Flatulence.      Intervention Category Intermediate Interventions: Other:  Devra Stare Cornelia Copa 12/31/2016, 10:08 PM

## 2017-01-01 LAB — POCT I-STAT EG7
ACID-BASE EXCESS: 4 mmol/L — AB (ref 0.0–2.0)
ACID-BASE EXCESS: 7 mmol/L — AB (ref 0.0–2.0)
Acid-Base Excess: 2 mmol/L (ref 0.0–2.0)
Acid-Base Excess: 4 mmol/L — ABNORMAL HIGH (ref 0.0–2.0)
Acid-Base Excess: 7 mmol/L — ABNORMAL HIGH (ref 0.0–2.0)
BICARBONATE: 28 mmol/L (ref 20.0–28.0)
BICARBONATE: 31.7 mmol/L — AB (ref 20.0–28.0)
Bicarbonate: 26.8 mmol/L (ref 20.0–28.0)
Bicarbonate: 29.1 mmol/L — ABNORMAL HIGH (ref 20.0–28.0)
Bicarbonate: 31.4 mmol/L — ABNORMAL HIGH (ref 20.0–28.0)
CALCIUM ION: 0.87 mmol/L — AB (ref 1.15–1.40)
CALCIUM ION: 0.87 mmol/L — AB (ref 1.15–1.40)
CALCIUM ION: 0.93 mmol/L — AB (ref 1.15–1.40)
Calcium, Ion: 0.45 mmol/L — CL (ref 1.15–1.40)
Calcium, Ion: 0.46 mmol/L — CL (ref 1.15–1.40)
HCT: 24 % — ABNORMAL LOW (ref 39.0–52.0)
HCT: 24 % — ABNORMAL LOW (ref 39.0–52.0)
HCT: 25 % — ABNORMAL LOW (ref 39.0–52.0)
HEMATOCRIT: 21 % — AB (ref 39.0–52.0)
HEMATOCRIT: 22 % — AB (ref 39.0–52.0)
HEMOGLOBIN: 7.1 g/dL — AB (ref 13.0–17.0)
HEMOGLOBIN: 8.2 g/dL — AB (ref 13.0–17.0)
HEMOGLOBIN: 8.5 g/dL — AB (ref 13.0–17.0)
Hemoglobin: 7.5 g/dL — ABNORMAL LOW (ref 13.0–17.0)
Hemoglobin: 8.2 g/dL — ABNORMAL LOW (ref 13.0–17.0)
O2 SAT: 71 %
O2 SAT: 66 %
O2 Saturation: 63 %
O2 Saturation: 68 %
O2 Saturation: 77 %
PCO2 VEN: 43.8 mmHg — AB (ref 44.0–60.0)
PCO2 VEN: 43.6 mmHg — AB (ref 44.0–60.0)
PCO2 VEN: 43.4 mmHg — AB (ref 44.0–60.0)
PH VEN: 7.468 — AB (ref 7.250–7.430)
PH VEN: 7.433 — AB (ref 7.250–7.430)
PH VEN: 7.468 — AB (ref 7.250–7.430)
PO2 VEN: 36 mmHg (ref 32.0–45.0)
PO2 VEN: 33 mmHg (ref 32.0–45.0)
POTASSIUM: 4.1 mmol/L (ref 3.5–5.1)
POTASSIUM: 4.4 mmol/L (ref 3.5–5.1)
Patient temperature: 98.8
Patient temperature: 98.9
Potassium: 4.1 mmol/L (ref 3.5–5.1)
Potassium: 4.2 mmol/L (ref 3.5–5.1)
Potassium: 4.4 mmol/L (ref 3.5–5.1)
SODIUM: 137 mmol/L (ref 135–145)
SODIUM: 137 mmol/L (ref 135–145)
SODIUM: 138 mmol/L (ref 135–145)
Sodium: 136 mmol/L (ref 135–145)
Sodium: 136 mmol/L (ref 135–145)
TCO2: 28 mmol/L (ref 0–100)
TCO2: 29 mmol/L (ref 0–100)
TCO2: 30 mmol/L (ref 0–100)
TCO2: 33 mmol/L (ref 0–100)
TCO2: 33 mmol/L (ref 0–100)
pCO2, Ven: 40 mmHg — ABNORMAL LOW (ref 44.0–60.0)
pCO2, Ven: 41.1 mmHg — ABNORMAL LOW (ref 44.0–60.0)
pH, Ven: 7.423 (ref 7.250–7.430)
pH, Ven: 7.455 — ABNORMAL HIGH (ref 7.250–7.430)
pO2, Ven: 32 mmHg (ref 32.0–45.0)
pO2, Ven: 35 mmHg (ref 32.0–45.0)
pO2, Ven: 40 mmHg (ref 32.0–45.0)

## 2017-01-01 LAB — POCT I-STAT, CHEM 8
BUN: 18 mg/dL (ref 6–20)
BUN: 19 mg/dL (ref 6–20)
BUN: 21 mg/dL — ABNORMAL HIGH (ref 6–20)
BUN: 21 mg/dL — ABNORMAL HIGH (ref 6–20)
CALCIUM ION: 0.57 mmol/L — AB (ref 1.15–1.40)
CALCIUM ION: 0.99 mmol/L — AB (ref 1.15–1.40)
CALCIUM ION: 1.06 mmol/L — AB (ref 1.15–1.40)
CHLORIDE: 91 mmol/L — AB (ref 101–111)
CREATININE: 2.7 mg/dL — AB (ref 0.61–1.24)
Calcium, Ion: 0.51 mmol/L — CL (ref 1.15–1.40)
Chloride: 91 mmol/L — ABNORMAL LOW (ref 101–111)
Chloride: 91 mmol/L — ABNORMAL LOW (ref 101–111)
Chloride: 92 mmol/L — ABNORMAL LOW (ref 101–111)
Creatinine, Ser: 2.9 mg/dL — ABNORMAL HIGH (ref 0.61–1.24)
Creatinine, Ser: 3.3 mg/dL — ABNORMAL HIGH (ref 0.61–1.24)
Creatinine, Ser: 3.4 mg/dL — ABNORMAL HIGH (ref 0.61–1.24)
GLUCOSE: 114 mg/dL — AB (ref 65–99)
GLUCOSE: 127 mg/dL — AB (ref 65–99)
GLUCOSE: 155 mg/dL — AB (ref 65–99)
GLUCOSE: 161 mg/dL — AB (ref 65–99)
HCT: 19 % — ABNORMAL LOW (ref 39.0–52.0)
HCT: 19 % — ABNORMAL LOW (ref 39.0–52.0)
HCT: 23 % — ABNORMAL LOW (ref 39.0–52.0)
HCT: 24 % — ABNORMAL LOW (ref 39.0–52.0)
HEMOGLOBIN: 6.5 g/dL — AB (ref 13.0–17.0)
HEMOGLOBIN: 7.8 g/dL — AB (ref 13.0–17.0)
HEMOGLOBIN: 8.2 g/dL — AB (ref 13.0–17.0)
Hemoglobin: 6.5 g/dL — CL (ref 13.0–17.0)
POTASSIUM: 4.4 mmol/L (ref 3.5–5.1)
Potassium: 4.3 mmol/L (ref 3.5–5.1)
Potassium: 4.3 mmol/L (ref 3.5–5.1)
Potassium: 4.5 mmol/L (ref 3.5–5.1)
SODIUM: 133 mmol/L — AB (ref 135–145)
SODIUM: 134 mmol/L — AB (ref 135–145)
SODIUM: 135 mmol/L (ref 135–145)
SODIUM: 136 mmol/L (ref 135–145)
TCO2: 29 mmol/L (ref 0–100)
TCO2: 29 mmol/L (ref 0–100)
TCO2: 31 mmol/L (ref 0–100)
TCO2: 31 mmol/L (ref 0–100)

## 2017-01-01 LAB — RENAL FUNCTION PANEL
ANION GAP: 13 (ref 5–15)
Anion gap: 14 (ref 5–15)
BUN: 20 mg/dL (ref 6–20)
BUN: 17 mg/dL (ref 6–20)
CALCIUM: 8.5 mg/dL — AB (ref 8.9–10.3)
CALCIUM: 7.6 mg/dL — AB (ref 8.9–10.3)
CO2: 30 mmol/L (ref 22–32)
CO2: 29 mmol/L (ref 22–32)
CREATININE: 3.01 mg/dL — AB (ref 0.61–1.24)
Chloride: 93 mmol/L — ABNORMAL LOW (ref 101–111)
Chloride: 94 mmol/L — ABNORMAL LOW (ref 101–111)
Creatinine, Ser: 3.17 mg/dL — ABNORMAL HIGH (ref 0.61–1.24)
GFR calc Af Amer: 25 mL/min — ABNORMAL LOW (ref >60)
GFR, EST AFRICAN AMERICAN: 24 mL/min — AB (ref >60)
GFR, EST NON AFRICAN AMERICAN: 21 mL/min — AB (ref >60)
GFR, EST NON AFRICAN AMERICAN: 22 mL/min — AB (ref >60)
GLUCOSE: 111 mg/dL — AB (ref 65–99)
Glucose, Bld: 131 mg/dL — ABNORMAL HIGH (ref 65–99)
PHOSPHORUS: 3.4 mg/dL (ref 2.5–4.6)
PHOSPHORUS: 3.1 mg/dL (ref 2.5–4.6)
POTASSIUM: 4.5 mmol/L (ref 3.5–5.1)
POTASSIUM: 4.4 mmol/L (ref 3.5–5.1)
SODIUM: 137 mmol/L (ref 135–145)
Sodium: 136 mmol/L (ref 135–145)

## 2017-01-01 LAB — PREPARE RBC (CROSSMATCH)

## 2017-01-01 LAB — BASIC METABOLIC PANEL
ANION GAP: 13 (ref 5–15)
BUN: 20 mg/dL (ref 6–20)
CHLORIDE: 93 mmol/L — AB (ref 101–111)
CO2: 30 mmol/L (ref 22–32)
Calcium: 8.5 mg/dL — ABNORMAL LOW (ref 8.9–10.3)
Creatinine, Ser: 3.14 mg/dL — ABNORMAL HIGH (ref 0.61–1.24)
GFR, EST AFRICAN AMERICAN: 24 mL/min — AB (ref >60)
GFR, EST NON AFRICAN AMERICAN: 21 mL/min — AB (ref >60)
Glucose, Bld: 112 mg/dL — ABNORMAL HIGH (ref 65–99)
POTASSIUM: 4.6 mmol/L (ref 3.5–5.1)
SODIUM: 136 mmol/L (ref 135–145)

## 2017-01-01 LAB — CBC
HCT: 21.1 % — ABNORMAL LOW (ref 39.0–52.0)
HEMOGLOBIN: 7.1 g/dL — AB (ref 13.0–17.0)
MCH: 28.6 pg (ref 26.0–34.0)
MCHC: 33.6 g/dL (ref 30.0–36.0)
MCV: 85.1 fL (ref 78.0–100.0)
PLATELETS: 114 10*3/uL — AB (ref 150–400)
RBC: 2.48 MIL/uL — AB (ref 4.22–5.81)
RDW: 16.5 % — ABNORMAL HIGH (ref 11.5–15.5)
WBC: 18.5 10*3/uL — AB (ref 4.0–10.5)

## 2017-01-01 LAB — MAGNESIUM: MAGNESIUM: 1.9 mg/dL (ref 1.7–2.4)

## 2017-01-01 LAB — CALCIUM, IONIZED: CALCIUM, IONIZED, SERUM: 5.6 mg/dL (ref 4.5–5.6)

## 2017-01-01 LAB — PHOSPHORUS: PHOSPHORUS: 3.4 mg/dL (ref 2.5–4.6)

## 2017-01-01 MED ORDER — SODIUM CHLORIDE 0.9 % IV SOLN
Freq: Once | INTRAVENOUS | Status: AC
  Administered 2017-01-01: 13:00:00 via INTRAVENOUS

## 2017-01-01 NOTE — Progress Notes (Signed)
Subjective: Interval History: has no complaint .  Objective: Vital signs in last 24 hours: Temp:  [98.4 F (36.9 C)-99.2 F (37.3 C)] 98.9 F (37.2 C) (04/08 0335) Pulse Rate:  [101-113] 102 (04/08 0700) Resp:  [12-21] 15 (04/08 0700) BP: (82-110)/(48-83) 93/54 (04/08 0700) SpO2:  [97 %-100 %] 98 % (04/08 0700) Weight:  [85 kg (187 lb 6.3 oz)] 85 kg (187 lb 6.3 oz) (04/08 0500) Weight change: -1.8 kg (-3 lb 15.5 oz)  Intake/Output from previous day: 04/07 0701 - 04/08 0700 In: 2655 [P.O.:340; I.V.:2315] Out: 5536  Intake/Output this shift: No intake/output data recorded.  General appearance: cooperative, slowed mentation and confused, asterxis Neck: IJ cath Resp: rales bibasilar Cardio: S1, S2 normal and systolic murmur: holosystolic 2/6, blowing at apex GI: distended, liver hard, down 10 cm Extremities: edema 2+  Jaundiced  Lab Results:  Recent Labs  12/31/16 1739  01/01/17 0350 01/01/17 0406 01/01/17 0411  WBC 17.0*  --  18.5*  --   --   HGB 7.3*  < > 7.1* 6.5* 8.2*  HCT 20.6*  < > 21.1* 19.0* 24.0*  PLT 112*  --  114*  --   --   < > = values in this interval not displayed. BMET:  Recent Labs  12/31/16 1738  01/01/17 0350 01/01/17 0406 01/01/17 0411  NA 135  < > 136  136 134* 136  K 4.5  < > 4.5  4.6 4.5 4.3  CL 92*  < > 93*  93* 92* 91*  CO2 28  --  30  30  --   --   GLUCOSE 135*  < > 111*  112* 114* 155*  BUN 24*  < > 20  20 21* 18  CREATININE 3.26*  < > 3.17*  3.14* 3.30* 2.70*  CALCIUM 9.6  --  8.5*  8.5*  --   --   < > = values in this interval not displayed. No results for input(s): PTH in the last 72 hours. Iron Studies: No results for input(s): IRON, TIBC, TRANSFERRIN, FERRITIN in the last 72 hours.  Studies/Results: Ct Abdomen Pelvis Wo Contrast  Result Date: 12/30/2016 CLINICAL DATA:  GI bleed.  Is pancreatic cancer. EXAM: CT ABDOMEN AND PELVIS WITHOUT CONTRAST TECHNIQUE: Multidetector CT imaging of the abdomen and pelvis was  performed following the standard protocol without IV contrast. COMPARISON:  Abdomen CT 11/02/2016 FINDINGS: Lower chest: Subsegmental atelectasis noted right lower lobe. 5 mm perifissural nodule seen in the right lower lung on image 1 series 4. Hepatobiliary: Bulky liver lesions are again noted, poorly defined given the lack of intravenous contrast material. Some of the lesions probably measure up to 7-8 cm. Lesions appear confluent in some areas. Gallbladder not visualized. Insert no biliary Pancreas: Pancreatic head mass seen previously at 4.0 x 2.9 cm now measures 3.8 x 3.1 cm Spleen: No splenomegaly. No focal mass lesion. Adrenals/Urinary Tract: No adrenal nodule or mass. No hydronephrosis in either kidney. Bladder moderately distended. Stomach/Bowel: Stomach is nondistended. No evidence for small bowel obstruction. No colonic obstruction. Vascular/Lymphatic: No abdominal aortic aneurysm. No definite lymphadenopathy in the abdomen or pelvis. Reproductive: The prostate gland and seminal vesicles have normal imaging features. Other: Small to moderate volume intraperitoneal free fluid is evident. Musculoskeletal: Diffuse body wall and mesenteric edema is noted. Bone windows reveal no worrisome lytic or sclerotic osseous lesions. IMPRESSION: Limited study due to lack of intravenous contrast and cachexia. Bulky hepatic metastases with no substantial change in size of the pancreatic  head mass. Small moderate volume ascites with diffuse body wall edema. Electronically Signed   By: Misty Stanley M.D.   On: 12/30/2016 15:33    I have reviewed the patient's current medications.  Assessment/Plan: 1 AKI oliguric ATN, vol better, lower net neg, solute fair. K ok. If stablizes, IHD within 48h 2 GIB stable ?? Cause 3 Metastatic pnacreatic Ca P CRRT, stop in 24-48h, neg 50    LOS: 4 days   Henry Barton 01/01/2017,7:35 AM

## 2017-01-01 NOTE — Progress Notes (Addendum)
PULMONARY  / CRITICAL CARE MEDICINE  Name: Henry Barton MRN: [id number] DOB: 1961/04/23    LOS: 40  REFERRING MD :  Winfred Leeds  CHIEF COMPLAINT:  weakness  BRIEF PATIENT DESCRIPTION: Henry Barton is a 56 y.o. male with PMH as outlined below including pancreatic cancer with mets to liver, currently undergoing chemotherapy under the care Dr. Berneice Gandy at [facility] (diagnosed 11/10/16 by liver biopsy, started chemo 11/29/16). He presented to [facility] ED on 4/3 with weakness and hypotension. Symptoms began around noon that afternoon. He had also been having dark bloody stools that started 1 day prior along with intermittent lightheadedness. hgb 4.3, FOBT positive, K >7.5 in   STUDIES:  LIVER BIOPSY 11/10/16:  Metastatic adenocarcinoma consistent with pancreatic primary. EKG 4/3:  Personally reviewed by me. Borderline QRS complex widening. Some peaking of the T waves. QTc prolonged.  MICROBIOLOGY: MRSA (-)  ANTIBIOTICS: None.  SIGNIFICANT EVENTS: 04/03 - Admit with GI Bleeding & Hyperkalemia  LINES/TUBES: PIV x2 HD cath  4/4  SUBJECTIVE: Alert, denies any pain. Per RN he has not had any bloody BMs.     VITAL SIGNS: Temp:  [98.4 F (36.9 C)-99.2 F (37.3 C)] 98.9 F (37.2 C) (04/08 0335) Pulse Rate:  [101-113] 102 (04/08 0600) Resp:  [12-21] 16 (04/08 0600) BP: (82-110)/(48-83) 88/59 (04/08 0600) SpO2:  [97 %-100 %] 99 % (04/08 0600) Weight:  [187 lb 6.3 oz (85 kg)] 187 lb 6.3 oz (85 kg) (04/08 0500)  HEMODYNAMICS:   VENTILATOR SETTINGS:   INTAKE / OUTPUT: Intake/Output      04/07 0701 - 04/08 0700   P.O. 340   I.V. (mL/kg) 2225 (26.2)   Total Intake(mL/kg) 2565 (30.2)   Other 5355   Total Output 5355   Net -2790        PHYSICAL EXAMINATION: General:  NAD, alert and interactive Neuro:  RASS 0, moving all ext to command HEENT:  [place]/AT, PERRL, EOM-I and MMM, b/l sclera icterus, rt HD cath site  Cardiovascular:  RRR, no murmurs Lungs:  CTAB, no wheezing or  crackles Abdomen:  Distended, palpable mass RUQ and midepigastric region, non TTP Skin:  Warm and dry Ext: 2+ pedal edema b/l, SCDs on    LABS: Cbc  Recent Labs Lab 12/31/16 0346  12/31/16 1739  01/01/17 0350 01/01/17 0406 01/01/17 0411  WBC 15.9*  --  17.0*  --  18.5*  --   --   HGB 7.0*  < > 7.3*  < > 7.1* 6.5* 8.2*  HCT 20.0*  < > 20.6*  < > 21.1* 19.0* 24.0*  PLT 97*  --  112*  --  114*  --   --   < > = values in this interval not displayed. Chemistry  Recent Labs Lab 12/30/16 0453  12/31/16 0346  12/31/16 1738  01/01/17 0350 01/01/17 0406 01/01/17 0411  NA 134*  < > 134*  < > 135  < > 136  136 134* 136  K 4.5  < > 4.5  < > 4.5  < > 4.5  4.6 4.5 4.3  CL 89*  < > 89*  < > 92*  < > 93*  93* 92* 91*  CO2 27  < > 26  --  28  --  30  30  --   --   BUN 54*  < > 32*  < > 24*  < > 20  20 21* 18  CREATININE 4.60*  < > 3.58*  < > 3.26*  < >  3.17*  3.14* 3.30* 2.70*  CALCIUM 9.9  < > 11.3*  --  9.6  --  8.5*  8.5*  --   --   MG 2.0  --  1.8  --   --   --  1.9  --   --   PHOS 5.4*  < > 4.7*  --  3.8  --  3.4  3.4  --   --   GLUCOSE 130*  < > 133*  < > 135*  < > 111*  112* 114* 155*  < > = values in this interval not displayed. Liver fxn  Recent Labs Lab 12/27/16 1927 12/28/16 0647  12/31/16 0346 12/31/16 1738 01/01/17 0350  AST 302* 313*  --   --   --   --   ALT 131* 124*  --   --   --   --   ALKPHOS 1,285* 1,224*  --   --   --   --   BILITOT 16.9* 16.5*  --   --   --   --   PROT 5.6* 4.9*  --   --   --   --   ALBUMIN 1.3* 1.2*  < > <1.0* <1.0* <1.0*  < > = values in this interval not displayed. coags  Recent Labs Lab 12/27/16 1927 12/28/16 0647 12/30/16 0814  APTT  --  40* 36  INR 1.96 1.60 1.34   Sepsis markers  Recent Labs Lab 12/28/16 0647 12/28/16 0944  LATICACIDVEN 3.6* 4.2*   Cardiac markers  Recent Labs Lab 12/28/16 0647 12/28/16 1210 12/28/16 1829  CKTOTAL  --  148  --   TROPONINI 0.09* 0.09* 0.09*   ABG  Recent  Labs Lab 12/28/16 1430  01/01/17 0009 01/01/17 0406 01/01/17 0411  PHART 7.505*  --   --   --   --   PCO2ART 22.9*  --   --   --   --   PO2ART 101  --   --   --   --   HCO3 18.0*  --   --   --   --   TCO2  --   < > 29 31 29   < > = values in this interval not displayed.  CBG trend  Recent Labs Lab 12/30/16 1214 12/30/16 1553 12/30/16 1954 12/30/16 2343 12/31/16 0333  GLUCAP 144* 92 128* 128* 124*     ASSESSMENT / PLAN:   RENAL ASSESSMENT:   AKI PLAN:  - CRRT per renal, continue through the weekend as not stable enough for intermittent HD at this time - anticipate intermittent HD next week - will need to have a family discussion Monday   GASTROINTESTINAL ASSESSMENT:   Acute Upper GI Bleeding - Likely related to metastatic pancreatic CA +/- PUD Metastatic pancreatic CA Transaminitis Moderate Protein-Calorie Malnutrition PLAN:   -  Appreciate GI recommendations.  Conservative management for now.  Risks likely outweigh benefits of procedure.  -  Daily CBC, Transfuse for Hb < 7 - Thorazine 25mg  q8h prn for hiccups  - SCDs - Protonix IV BID - given 1uRBC this morning for hgb of 7.1, hgb 7.0 the day prior.   HEMATOLOGIC Acute Blood Loss Anemia 2/2 PUD + Tumor invasion Coagulopathy - Multifactorial. Pancreatic Cancer with Liver Mets - Follows with Dr. Berneice Gandy at [facility].  P:  - CBCs daily - holding off on EGD - Plan to transfuse for Hgb <7.0 or active bleeding - SCDs - Palliative care following - Percocet q4h prn  for pain   PULMONARY A: No acute issues. P:   - Monitoring continuous pulse oximetry.  - Monitor for airway protection   CARDIOVASCULAR A:  No acute issues. CHFpEF P:  - Monitoring patient on telemetry   INFECTIOUS A:   Leukocytosis - Suspect demargination & increased bone marrow production with blood loss. P:   - Trending cell counts - Monitoring for signs of infection.    ENDOCRINE A:   No issues. S/P hypoglycemia P:   -  Monitoring glucose with accu-checks q4hr with MD notification parameters.   NEUROLOGIC A:   Depression Chronic pain related to CA  P:   - Monitor closely in ICU.   Julious Oka, MD Internal Medicine Resident, PGY [facility] Internal Medicine Program Pager: [phone]   ATTENDING NOTE / ATTESTATION NOTE :   I have discussed the case with the resident/APP Dr. Hulen Luster  I agree with the resident/APP's  history, physical examination, assessment, and plans.    I have edited the above note and modified it according to our agreed history, physical examination, assessment and plan.   Patient admitted with hemorrhagic shock related to GI bleed. Blood pressure remains soft. Comfortable, not in distress. Hemoglobin has been stable. Continue CRRT. Continue other medications. Need to determine overall goals of care. Palliative care is involved. Plan for meeting tomorrow or Tuesday. Plan to transfuse 1 unit packed red blood cells today to optimize him.  Family : No family at bedside.    Monica Becton, MD 01/01/2017, 8:07 PM [facility] Pager [phone] After 3 pm or if no answer, call [phone]

## 2017-01-02 LAB — COMPREHENSIVE METABOLIC PANEL
ALK PHOS: 868 U/L — AB (ref 38–126)
ALT: 84 U/L — ABNORMAL HIGH (ref 17–63)
ANION GAP: 15 (ref 5–15)
AST: 192 U/L — AB (ref 15–41)
BUN: 13 mg/dL (ref 6–20)
CALCIUM: 7.2 mg/dL — AB (ref 8.9–10.3)
CO2: 26 mmol/L (ref 22–32)
Chloride: 95 mmol/L — ABNORMAL LOW (ref 101–111)
Creatinine, Ser: 2.73 mg/dL — ABNORMAL HIGH (ref 0.61–1.24)
GFR calc Af Amer: 29 mL/min — ABNORMAL LOW (ref >60)
GFR calc non Af Amer: 25 mL/min — ABNORMAL LOW (ref >60)
GLUCOSE: 164 mg/dL — AB (ref 65–99)
POTASSIUM: 3.9 mmol/L (ref 3.5–5.1)
SODIUM: 136 mmol/L (ref 135–145)
Total Bilirubin: 15.3 mg/dL — ABNORMAL HIGH (ref 0.3–1.2)
Total Protein: 4.7 g/dL — ABNORMAL LOW (ref 6.5–8.1)

## 2017-01-02 LAB — CBC
HEMATOCRIT: 21.8 % — AB (ref 39.0–52.0)
Hemoglobin: 7.4 g/dL — ABNORMAL LOW (ref 13.0–17.0)
MCH: 29.2 pg (ref 26.0–34.0)
MCHC: 33.9 g/dL (ref 30.0–36.0)
MCV: 86.2 fL (ref 78.0–100.0)
Platelets: 104 10*3/uL — ABNORMAL LOW (ref 150–400)
RBC: 2.53 MIL/uL — ABNORMAL LOW (ref 4.22–5.81)
RDW: 16.6 % — AB (ref 11.5–15.5)
WBC: 15 10*3/uL — AB (ref 4.0–10.5)

## 2017-01-02 LAB — POCT I-STAT EG7
ACID-BASE EXCESS: 4 mmol/L — AB (ref 0.0–2.0)
Acid-Base Excess: 3 mmol/L — ABNORMAL HIGH (ref 0.0–2.0)
Acid-Base Excess: 5 mmol/L — ABNORMAL HIGH (ref 0.0–2.0)
BICARBONATE: 27.6 mmol/L (ref 20.0–28.0)
Bicarbonate: 28.2 mmol/L — ABNORMAL HIGH (ref 20.0–28.0)
Bicarbonate: 29.2 mmol/L — ABNORMAL HIGH (ref 20.0–28.0)
CALCIUM ION: 0.37 mmol/L — AB (ref 1.15–1.40)
Calcium, Ion: 0.45 mmol/L — CL (ref 1.15–1.40)
Calcium, Ion: 0.84 mmol/L — CL (ref 1.15–1.40)
HCT: 23 % — ABNORMAL LOW (ref 39.0–52.0)
HCT: 24 % — ABNORMAL LOW (ref 39.0–52.0)
HEMATOCRIT: 28 % — AB (ref 39.0–52.0)
HEMOGLOBIN: 9.5 g/dL — AB (ref 13.0–17.0)
HEMOGLOBIN: 7.8 g/dL — AB (ref 13.0–17.0)
Hemoglobin: 8.2 g/dL — ABNORMAL LOW (ref 13.0–17.0)
O2 SAT: 65 %
O2 Saturation: 67 %
O2 Saturation: 70 %
PCO2 VEN: 41.9 mmHg — AB (ref 44.0–60.0)
PH VEN: 7.427 (ref 7.250–7.430)
PH VEN: 7.452 — AB (ref 7.250–7.430)
PO2 VEN: 33 mmHg (ref 32.0–45.0)
POTASSIUM: 4.2 mmol/L (ref 3.5–5.1)
POTASSIUM: 4 mmol/L (ref 3.5–5.1)
Patient temperature: 98.8
Patient temperature: 98.7
Potassium: 3.8 mmol/L (ref 3.5–5.1)
SODIUM: 139 mmol/L (ref 135–145)
SODIUM: 137 mmol/L (ref 135–145)
Sodium: 139 mmol/L (ref 135–145)
TCO2: 29 mmol/L (ref 0–100)
TCO2: 29 mmol/L (ref 0–100)
TCO2: 30 mmol/L (ref 0–100)
pCO2, Ven: 42.6 mmHg — ABNORMAL LOW (ref 44.0–60.0)
pCO2, Ven: 41.9 mmHg — ABNORMAL LOW (ref 44.0–60.0)
pH, Ven: 7.429 (ref 7.250–7.430)
pO2, Ven: 34 mmHg (ref 32.0–45.0)
pO2, Ven: 36 mmHg (ref 32.0–45.0)

## 2017-01-02 LAB — PHOSPHORUS: Phosphorus: 2.3 mg/dL — ABNORMAL LOW (ref 2.5–4.6)

## 2017-01-02 LAB — CALCIUM, IONIZED: Calcium, Ionized, Serum: 4.4 mg/dL — ABNORMAL LOW (ref 4.5–5.6)

## 2017-01-02 LAB — MAGNESIUM: MAGNESIUM: 1.9 mg/dL (ref 1.7–2.4)

## 2017-01-02 MED ORDER — ENSURE ENLIVE PO LIQD
237.0000 mL | Freq: Two times a day (BID) | ORAL | Status: DC
  Administered 2017-01-04: 237 mL via ORAL

## 2017-01-02 MED ORDER — ACETAMINOPHEN 325 MG PO TABS
650.0000 mg | ORAL_TABLET | Freq: Three times a day (TID) | ORAL | Status: DC | PRN
  Administered 2017-01-05 – 2017-01-11 (×6): 650 mg via ORAL
  Filled 2017-01-02 (×6): qty 2

## 2017-01-02 NOTE — Progress Notes (Signed)
Patient ID: Henry Barton, male   DOB: 05-07-1961, 56 y.o.   MRN: [id number]  This NP visited patient at the bedside as a follow up and continued support to paient and family.  Today he tells me he "feels pretty good" and remains open to offered and  available medical interventions to prolong life. He expresses concern for his wife's ability to cope with the situation.  Discussed with patient the importance of continued conversation with family and their  medical providers regarding overall plan of care and treatment options,  ensuring decisions are within the context of the patients values and GOCs.  Questions and concerns addressed  Time in  1230         Time out    1255     Total time 25 minutes  Greater than 50% of the time was spent in counseling and coordination of care  Wadie Lessen NP  Palliative Medicine Team Team Phone # [phone] Pager [phone]

## 2017-01-02 NOTE — Progress Notes (Signed)
Assessment/Plan: 1 AKI oliguric ATN, prob intravasc low 2 GIB stable 3 Metastatic pnacreatic Ca P Stop CRRT , IHD prn    Subjective: Interval History: No supp oxygen, No UOP  Objective: Vital signs in last 24 hours: Temp:  [98 F (36.7 C)-99 F (37.2 C)] 99 F (37.2 C) (04/09 0330) Pulse Rate:  [101-119] 106 (04/09 0700) Resp:  [11-29] 13 (04/09 0700) BP: (81-125)/(49-79) 91/60 (04/09 0700) SpO2:  [93 %-100 %] 98 % (04/09 0700) Weight:  [84.2 kg (185 lb 10 oz)] 84.2 kg (185 lb 10 oz) (04/09 0500) Weight change: -0.8 kg (-1 lb 12.2 oz)  Intake/Output from previous day: 04/08 0701 - 04/09 0700 In: 2878.3 [P.O.:200; I.V.:2300; Blood:353.3; IV Piggyback:25] Out: 2641  Intake/Output this shift: No intake/output data recorded.  General appearance: alert and cooperative GI: palp mass ruq anasarca  Lab Results:  Recent Labs  01/01/17 0350  01/02/17 0500 01/02/17 0506 01/02/17 0512  WBC 18.5*  --  15.0*  --   --   HGB 7.1*  < > 7.4* 8.2* 7.8*  HCT 21.1*  < > 21.8* 24.0* 23.0*  PLT 114*  --  104*  --   --   < > = values in this interval not displayed. BMET:  Recent Labs  01/01/17 1600  01/02/17 0500 01/02/17 0506 01/02/17 0512  NA 137  < > 136 139 137  K 4.4  < > 3.9 3.8 4.0  CL 94*  --  95*  --   --   CO2 29  --  26  --   --   GLUCOSE 131*  --  164*  --   --   BUN 17  --  13  --   --   CREATININE 3.01*  --  2.73*  --   --   CALCIUM 7.6*  --  7.2*  --   --   < > = values in this interval not displayed. No results for input(s): PTH in the last 72 hours. Iron Studies: No results for input(s): IRON, TIBC, TRANSFERRIN, FERRITIN in the last 72 hours. Studies/Results: No results found.  Scheduled: . sodium chloride   Intravenous Once  . midazolam  2 mg Intravenous Once  . ondansetron  4 mg Intravenous Once  . pantoprazole  40 mg Intravenous Q12H   Continuous: . calcium gluconate infusion for CRRT 20 g (01/02/17 0700)  . norepinephrine (LEVOPHED) Adult  infusion Stopped (12/29/16 0800)  . dialysis replacement fluid (prismasate) 800 mL/hr at 01/01/17 1724  . dialysate (PRISMASATE) 2,000 mL/hr at 01/02/17 0400  . sodium citrate 2 %/dextrose 2.5% solution 3000 mL 250 mL/hr at 01/01/17 2342    LOS: 5 days   Jonnelle Lawniczak C 01/02/2017,7:47 AM

## 2017-01-02 NOTE — Progress Notes (Signed)
PULMONARY  / CRITICAL CARE MEDICINE  Name: Henry Barton MRN: [id number] DOB: 1961/05/08    LOS: 6  REFERRING MD :  Winfred Leeds  CHIEF COMPLAINT:  weakness  BRIEF PATIENT DESCRIPTION: Henry Barton is a 56 y.o. male with PMH as outlined below including pancreatic cancer with mets to liver, currently undergoing chemotherapy under the care Dr. Berneice Gandy at [facility] (diagnosed 11/10/16 by liver biopsy, started chemo 11/29/16). He presented to [facility] ED on 4/3 with weakness and hypotension. Symptoms began around noon that afternoon. He had also been having dark bloody stools that started 1 day prior along with intermittent lightheadedness. hgb 4.3, FOBT positive, K >7.5 in   STUDIES:  LIVER BIOPSY 11/10/16:  Metastatic adenocarcinoma consistent with pancreatic primary. EKG 4/3:  Personally reviewed by me. Borderline QRS complex widening. Some peaking of the T waves. QTc prolonged.  MICROBIOLOGY: MRSA (-)  ANTIBIOTICS: None.  SIGNIFICANT EVENTS: 04/03 - Admit with GI Bleeding & Hyperkalemia  LINES/TUBES: PIV x2 HD cath  4/4  SUBJECTIVE: Alert, denies any pain. Per RN he has not had any bloody BMs.     VITAL SIGNS: Temp:  [98 F (36.7 C)-99 F (37.2 C)] 99 F (37.2 C) (04/09 0330) Pulse Rate:  [102-119] 106 (04/09 0700) Resp:  [11-29] 13 (04/09 0700) BP: (81-125)/(49-79) 91/60 (04/09 0700) SpO2:  [93 %-100 %] 98 % (04/09 0700) Weight:  [185 lb 10 oz (84.2 kg)] 185 lb 10 oz (84.2 kg) (04/09 0500)  HEMODYNAMICS:   VENTILATOR SETTINGS:   INTAKE / OUTPUT: Intake/Output      04/08 0701 - 04/09 0700 04/09 0701 - 04/10 0700   P.O. 200    I.V. (mL/kg) 2300 (27.3)    Blood 353.3    IV Piggyback 25    Total Intake(mL/kg) 2878.3 (34.2)    Other 3938    Total Output 3938     Net -1059.7           PHYSICAL EXAMINATION: General:  NAD, alert and interactive Neuro:  RASS 0, moving all ext to command HEENT:  [place]/AT, PERRL, EOM-I and MMM, b/l sclera icterus, rt HD cath site   Cardiovascular:  RRR, no murmurs Lungs:  CTAB, no wheezing or crackles Abdomen:  Distended, palpable mass RUQ and midepigastric region, non TTP Skin:  Warm and dry Ext: 2+ pedal edema b/l, SCDs on    LABS: Cbc  Recent Labs Lab 12/31/16 1739  01/01/17 0350  01/02/17 0500 01/02/17 0506 01/02/17 0512  WBC 17.0*  --  18.5*  --  15.0*  --   --   HGB 7.3*  < > 7.1*  < > 7.4* 8.2* 7.8*  HCT 20.6*  < > 21.1*  < > 21.8* 24.0* 23.0*  PLT 112*  --  114*  --  104*  --   --   < > = values in this interval not displayed. Chemistry  Recent Labs Lab 12/31/16 0346  01/01/17 0350  01/01/17 0411  01/01/17 1600  01/02/17 0500 01/02/17 0506 01/02/17 0512  NA 134*  < > 136  136  < > 136  < > 137  < > 136 139 137  K 4.5  < > 4.5  4.6  < > 4.3  < > 4.4  < > 3.9 3.8 4.0  CL 89*  < > 93*  93*  < > 91*  --  94*  --  95*  --   --   CO2 26  < > 30  30  --   --   --  29  --  26  --   --   BUN 32*  < > 20  20  < > 18  --  17  --  13  --   --   CREATININE 3.58*  < > 3.17*  3.14*  < > 2.70*  --  3.01*  --  2.73*  --   --   CALCIUM 11.3*  < > 8.5*  8.5*  --   --   --  7.6*  --  7.2*  --   --   MG 1.8  --  1.9  --   --   --   --   --  1.9  --   --   PHOS 4.7*  < > 3.4  3.4  --   --   --  3.1  --  2.3*  --   --   GLUCOSE 133*  < > 111*  112*  < > 155*  --  131*  --  164*  --   --   < > = values in this interval not displayed. Liver fxn  Recent Labs Lab 12/27/16 1927 12/28/16 0647  01/01/17 0350 01/01/17 1600 01/02/17 0500  AST 302* 313*  --   --   --  192*  ALT 131* 124*  --   --   --  84*  ALKPHOS 1,285* 1,224*  --   --   --  868*  BILITOT 16.9* 16.5*  --   --   --  15.3*  PROT 5.6* 4.9*  --   --   --  4.7*  ALBUMIN 1.3* 1.2*  < > <1.0* <1.0* <1.0*  < > = values in this interval not displayed. coags  Recent Labs Lab 12/27/16 1927 12/28/16 0647 12/30/16 0814  APTT  --  40* 36  INR 1.96 1.60 1.34   Sepsis markers  Recent Labs Lab 12/28/16 0647 12/28/16 0944   LATICACIDVEN 3.6* 4.2*   Cardiac markers  Recent Labs Lab 12/28/16 0647 12/28/16 1210 12/28/16 1829  CKTOTAL  --  148  --   TROPONINI 0.09* 0.09* 0.09*   ABG  Recent Labs Lab 12/28/16 1430  01/01/17 2343 01/02/17 0506 01/02/17 0512  PHART 7.505*  --   --   --   --   PCO2ART 22.9*  --   --   --   --   PO2ART 101  --   --   --   --   HCO3 18.0*  < > 28.0 27.6 29.2*  TCO2  --   < > 29 29 30   < > = values in this interval not displayed.  CBG trend  Recent Labs Lab 12/30/16 1214 12/30/16 1553 12/30/16 1954 12/30/16 2343 12/31/16 0333  GLUCAP 144* 92 128* 128* 124*     ASSESSMENT / PLAN:   RENAL ASSESSMENT:   AKI PLAN:  - per renal, stop CRRT, transitioned to intermittent HD today - transfer to tele bed, TRH to resume care tomorrow  GASTROINTESTINAL ASSESSMENT:   Acute Upper GI Bleeding - Likely related to metastatic pancreatic CA +/- PUD Metastatic pancreatic CA Transaminitis Moderate Protein-Calorie Malnutrition PLAN:   -  Appreciate GI recommendations.  Conservative management for now.  Risks likely outweigh benefits of procedure.  -  Daily CBC, Transfuse for Hb < 7 - Thorazine 25mg  q8h prn for hiccups  - SCDs - Protonix IV BID   HEMATOLOGIC Acute Blood Loss Anemia 2/2 PUD + Tumor invasion Coagulopathy - Multifactorial. Pancreatic  Cancer with Liver Mets - Follows with Dr. Berneice G[facility] at [facility].  P:  - CBCs daily - holding off on EGD - Plan to transfuse for Hgb <7.0 or active bleeding - SCDs - Palliative care following - Percocet q4h prn for pain   PULMONARY A: No acute issues. P:   - CTM   CARDIOVASCULAR A:  No acute issues. CHFpEF P:  - Monitoring patient on telemetry   INFECTIOUS A:   Leukocytosis - Suspect demargination & increased bone marrow production with blood loss. P:   - Trending cell counts - Monitoring for signs of infection.    ENDOCRINE A:   No issues. S/P hypoglycemia P:   - Monitoring glucose with  accu-checks q4hr with MD notification parameters.   NEUROLOGIC A:   Depression Chronic pain related to CA  P:   - CTM, transfer to tele   Julious Oka, MD Internal Medicine Resident, PGY [place] [facility] Program Pager: [phone]

## 2017-01-02 NOTE — Progress Notes (Signed)
ATTENDING NOTE: I have personally reviewed patient's available data, including medical history, events of note, physical examination and test results as part of my evaluation. I have discussed with resident/NP and other careteam providers such as pharmacist, RN and RRT & co-ordinated with consultants. In addition, I personally evaluated patient and elicited key history of    56 year old with advanced pancreatic cancer admitted with upper GI bleed and hemorrhagic shock and acute renal failure requiring CRRT. Remains critically ill on CRRT   On exam- awake, and warming blanket, interactive, s1s2 normal no S3 no rub, decreased breath sounds bilateral, right IJ dialysis catheter, no edema, soft nontender abdomen.   Labs show hemoglobin improved from 6.5-7.8 , last  Transfusion 1u PRBC on 4/8  normal coags, creatinine has decreased to 2.7 on CRRT and BUN to 13  Impression/plan-  Upper GI bleed with hemorrhagic shock- GI deferred EGD   AKI - can transition to intermittent dialysis in my opinion, need further discussion about goals of care - long-term dialysis certainly not an option here  Advanced pancreatic cancer- agree with palliative care and discussion, ongoing   Can transfer to telemetry and to triad 4/10 Rest per medical resident whose note is outlined above and that I agree with and edited in full.    The patient is critically ill with multiple organ systems failure and requires high complexity decision making for assessment and support, frequent evaluation and titration of therapies, application of advanced monitoring technologies and extensive interpretation of multiple databases. Critical Care Time devoted to patient care services described in this note independent of resident time is 31 minutes.    Rigoberto Noel MD

## 2017-01-03 LAB — BASIC METABOLIC PANEL
Anion gap: 13 (ref 5–15)
BUN: 21 mg/dL — AB (ref 6–20)
CO2: 27 mmol/L (ref 22–32)
CREATININE: 4.63 mg/dL — AB (ref 0.61–1.24)
Calcium: 7.7 mg/dL — ABNORMAL LOW (ref 8.9–10.3)
Chloride: 94 mmol/L — ABNORMAL LOW (ref 101–111)
GFR calc Af Amer: 15 mL/min — ABNORMAL LOW (ref >60)
GFR, EST NON AFRICAN AMERICAN: 13 mL/min — AB (ref >60)
Glucose, Bld: 65 mg/dL (ref 65–99)
POTASSIUM: 4.9 mmol/L (ref 3.5–5.1)
SODIUM: 134 mmol/L — AB (ref 135–145)

## 2017-01-03 LAB — CBC
HCT: 21.5 % — ABNORMAL LOW (ref 39.0–52.0)
Hemoglobin: 7.3 g/dL — ABNORMAL LOW (ref 13.0–17.0)
MCH: 29.3 pg (ref 26.0–34.0)
MCHC: 34 g/dL (ref 30.0–36.0)
MCV: 86.3 fL (ref 78.0–100.0)
PLATELETS: 116 10*3/uL — AB (ref 150–400)
RBC: 2.49 MIL/uL — ABNORMAL LOW (ref 4.22–5.81)
RDW: 16.8 % — ABNORMAL HIGH (ref 11.5–15.5)
WBC: 19.2 10*3/uL — ABNORMAL HIGH (ref 4.0–10.5)

## 2017-01-03 LAB — CALCIUM, IONIZED: CALCIUM, IONIZED, SERUM: 3.7 mg/dL — AB (ref 4.5–5.6)

## 2017-01-03 LAB — PHOSPHORUS: PHOSPHORUS: 3.9 mg/dL (ref 2.5–4.6)

## 2017-01-03 MED ORDER — SODIUM CHLORIDE 0.9% FLUSH
10.0000 mL | INTRAVENOUS | Status: DC | PRN
  Administered 2017-01-07 – 2017-01-08 (×4): 10 mL
  Administered 2017-01-13: 20 mL
  Administered 2017-01-14: 10 mL
  Filled 2017-01-03 (×6): qty 40

## 2017-01-03 MED ORDER — SODIUM CHLORIDE 0.9 % IV SOLN
250.0000 mL | INTRAVENOUS | Status: DC | PRN
  Administered 2017-01-03: 1000 mL via INTRAVENOUS

## 2017-01-03 MED ORDER — PANCRELIPASE (LIP-PROT-AMYL) 12000-38000 UNITS PO CPEP
24000.0000 [IU] | ORAL_CAPSULE | Freq: Three times a day (TID) | ORAL | Status: DC
  Administered 2017-01-03 – 2017-01-08 (×20): 24000 [IU] via ORAL
  Filled 2017-01-03 (×24): qty 2

## 2017-01-03 MED ORDER — SODIUM CHLORIDE 0.9% FLUSH
10.0000 mL | Freq: Two times a day (BID) | INTRAVENOUS | Status: DC
  Administered 2017-01-03 – 2017-01-11 (×6): 10 mL

## 2017-01-03 NOTE — Progress Notes (Signed)
Assessment/Plan: 1 AKI non-oliguric ATN, ? intravasc low 2 GIB stable 3 Metastatic pnacreatic Ca P Hold on HD today and follow, one liter NS  Subjective: Interval History: Fair appetite, occ dizziness  Objective: Vital signs in last 24 hours: Temp:  [98.5 F (36.9 C)-100.7 F (38.2 C)] 98.5 F (36.9 C) (04/10 0540) Pulse Rate:  [102-120] 102 (04/10 0540) Resp:  [15-26] 18 (04/10 0540) BP: (91-114)/(55-73) 102/58 (04/10 0540) SpO2:  [97 %-100 %] 98 % (04/10 0540) Weight:  [89.8 kg (198 lb)] 89.8 kg (198 lb) (04/10 0254) Weight change: 5.612 kg (12 lb 6 oz)  Intake/Output from previous day: 04/09 0701 - 04/10 0700 In: 1040 [P.O.:740; I.V.:300] Out: 1106 [Urine:801] Intake/Output this shift: No intake/output data recorded.  General appearance: alert and cooperative Resp: clear to auscultation bilaterally Chest wall: no tenderness GI: RUQ firmness  Lab Results:  Recent Labs  01/02/17 0500  01/02/17 0512 01/03/17 0417  WBC 15.0*  --   --  19.2*  HGB 7.4*  < > 7.8* 7.3*  HCT 21.8*  < > 23.0* 21.5*  PLT 104*  --   --  116*  < > = values in this interval not displayed. BMET:  Recent Labs  01/02/17 0500  01/02/17 0512 01/03/17 0417  NA 136  < > 137 134*  K 3.9  < > 4.0 4.9  CL 95*  --   --  94*  CO2 26  --   --  27  GLUCOSE 164*  --   --  65  BUN 13  --   --  21*  CREATININE 2.73*  --   --  4.63*  CALCIUM 7.2*  --   --  7.7*  < > = values in this interval not displayed. No results for input(s): PTH in the last 72 hours. Iron Studies: No results for input(s): IRON, TIBC, TRANSFERRIN, FERRITIN in the last 72 hours. Studies/Results: No results found.  Scheduled: . sodium chloride   Intravenous Once  . feeding supplement (ENSURE ENLIVE)  237 mL Oral BID BM  . pantoprazole  40 mg Intravenous Q12H  . sodium chloride flush  10-40 mL Intracatheter Q12H      LOS: 6 days   Sriyan Cutting C 01/03/2017,10:11 AM

## 2017-01-03 NOTE — Progress Notes (Signed)
PROGRESS NOTE    Henry Barton  QVZ:[id number] DOB: 07/13/1961 DOA: 12/27/2016 PCP: Pcp Not In System   Brief Narrative: Fransisca Kaufmann is a 56 y.o. male with a history of metastatic pancreatic cancer, hypertension, protein-calorie malnutrition, kidney disease. He presented with GI bleeding. He was admitted to the ICU for renal failure requiring CRRT and GI bleeding. While in the ICU, gastroenterology consulted and deferred EGD secondary to high risk. He was given 7 units PRBC for support. Nephrology started patient on CRRT    Assessment & Plan:  Active Problems:   GI bleed   Acute renal failure (ARF) (HCC)   Pancreatic carcinoma metastatic to liver [facility])   Palliative care by specialist   DNR (do not resuscitate) discussion   DNR (do not resuscitate)   AKI (acute kidney injury) ([place])   Severe protein-calorie malnutrition ([place])   Malignant neoplasm of pancreas ([place])   Acute GI bleeding Possibly secondary to erosion by mass.   Pancreatic adenocarcinoma Patient follows oncology at [facility]. Patient was receiving palliative chemotherapy. Patient and family are wanting full scope treatment and are hopeful to resume chemotherapy once he has improved from acute illness -continue percocet  Elevated transaminases Elevated alkaline phosphatase Likely secondary to hepatic masses. Improving.   Acute kidney injury Patient received CRRT in ICU. Nephrology following -Nephrology recommendations: IHD  Leukocytosis This has been chronic and stable. No evidence of infection. Fever overnight likely secondary to tumor and improved without Tylenol   DVT prophylaxis: SCDs Code Status: DNR/DNI Family Communication: Wife at bedside Disposition Plan: Discharge home pending nephrology recommendations   Consultants:   Critical care medicine  Nephrology  Gastroenterology  Procedures:   7 units of PRBC  HD catheter  Antimicrobials:  None    Subjective: Patient reports no bloody  stools. Not in any pain currently.  Objective: Vitals:   01/02/17 2312 01/03/17 0254 01/03/17 0540 01/03/17 1420  BP:   (!) 102/58 109/61  Pulse:   (!) 102 (!) 103  Resp:   18 15  Temp: 99.2 F (37.3 C)  98.5 F (36.9 C) 99.4 F (37.4 C)  TempSrc: Oral  Oral   SpO2:   98% 100%  Weight:  89.8 kg (198 lb)    Height:        Intake/Output Summary (Last 24 hours) at 01/03/17 1504 Last data filed at 01/03/17 1403  Gross per 24 hour  Intake              880 ml  Output              200 ml  Net              680 ml   Filed Weights   01/01/17 0500 01/02/17 0500 01/03/17 0254  Weight: 85 kg (187 lb 6.3 oz) 84.2 kg (185 lb 10 oz) 89.8 kg (198 lb)    Examination:  General exam: Appears calm and comfortable Respiratory system: Clear to auscultation. Respiratory effort normal. Cardiovascular system: S1 & S2 heard, RRR. No murmurs. Gastrointestinal system: Abdomen is nondistended, soft and nontender. Firm mass in upper quadrants. Normal bowel sounds heard. Central nervous system: Alert and oriented. No focal neurological deficits. Extremities: No edema. No calf tenderness Skin: No cyanosis. No rashes Psychiatry: Judgement and insight appear normal. Mood & affect appropriate.     Data Reviewed: I have personally reviewed following labs and imaging studies  CBC:  Recent Labs Lab 12/28/16 0647  12/31/16 0346  12/31/16 1739  01/01/17  3244  01/01/17 2343 01/02/17 0500 01/02/17 0506 01/02/17 0512 01/03/17 0417  WBC 19.2*  < > 15.9*  --  17.0*  --  18.5*  --   --  15.0*  --   --  19.2*  NEUTROABS 17.6*  --   --   --   --   --   --   --   --   --   --   --   --   HGB 6.0*  < > 7.0*  < > 7.3*  < > 7.1*  < > 7.5* 7.4* 8.2* 7.8* 7.3*  HCT 17.0*  < > 20.0*  < > 20.6*  < > 21.1*  < > 22.0* 21.8* 24.0* 23.0* 21.5*  MCV 79.8  < > 82.3  --  83.7  --  85.1  --   --  86.2  --   --  86.3  PLT 194  < > 97*  --  112*  --  114*  --   --  104*  --   --  116*  < > = values in this interval not  displayed. Basic Metabolic Panel:  Recent Labs Lab 12/28/16 0647  12/30/16 0453  12/31/16 0346  12/31/16 1738  01/01/17 0350 01/01/17 0406 01/01/17 0411  01/01/17 1600  01/01/17 2343 01/02/17 0500 01/02/17 0506 01/02/17 0512 01/03/17 0417  NA 127*  < > 134*  < > 134*  < > 135  < > 136  136 134* 136  < > 137  < > 137 136 139 137 134*  K 7.5*  < > 4.5  < > 4.5  < > 4.5  < > 4.5  4.6 4.5 4.3  < > 4.4  < > 4.1 3.9 3.8 4.0 4.9  CL 94*  < > 89*  < > 89*  < > 92*  < > 93*  93* 92* 91*  --  94*  --   --  95*  --   --  94*  CO2 11*  < > 27  < > 26  --  28  --  30  30  --   --   --  29  --   --  26  --   --  27  GLUCOSE 96  < > 130*  < > 133*  < > 135*  < > 111*  112* 114* 155*  --  131*  --   --  164*  --   --  65  BUN 180*  < > 54*  < > 32*  < > 24*  < > 20  20 21* 18  --  17  --   --  13  --   --  21*  CREATININE 10.67*  < > 4.60*  < > 3.58*  < > 3.26*  < > 3.17*  3.14* 3.30* 2.70*  --  3.01*  --   --  2.73*  --   --  4.63*  CALCIUM 7.2*  < > 9.9  < > 11.3*  --  9.6  --  8.5*  8.5*  --   --   --  7.6*  --   --  7.2*  --   --  7.7*  MG 2.5*  --  2.0  --  1.8  --   --   --  1.9  --   --   --   --   --   --  1.9  --   --   --  PHOS 11.7*  < > 5.4*  < > 4.7*  --  3.8  --  3.4  3.4  --   --   --  3.1  --   --  2.3*  --   --  3.9  < > = values in this interval not displayed. GFR: Estimated Creatinine Clearance: 20.3 mL/min (A) (by C-G formula based on SCr of 4.63 mg/dL (H)). Liver Function Tests:  Recent Labs Lab 12/27/16 1927 12/28/16 0647  12/31/16 0346 12/31/16 1738 01/01/17 0350 01/01/17 1600 01/02/17 0500  AST 302* 313*  --   --   --   --   --  192*  ALT 131* 124*  --   --   --   --   --  84*  ALKPHOS 1,285* 1,224*  --   --   --   --   --  868*  BILITOT 16.9* 16.5*  --   --   --   --   --  15.3*  PROT 5.6* 4.9*  --   --   --   --   --  4.7*  ALBUMIN 1.3* 1.2*  < > <1.0* <1.0* <1.0* <1.0* <1.0*  < > = values in this interval not displayed. No results for input(s):  LIPASE, AMYLASE in the last 168 hours. No results for input(s): AMMONIA in the last 168 hours. Coagulation Profile:  Recent Labs Lab 12/27/16 1927 12/28/16 0647 12/30/16 0814  INR 1.96 1.60 1.34   Cardiac Enzymes:  Recent Labs Lab 12/28/16 0647 12/28/16 1210 12/28/16 1829  CKTOTAL  --  148  --   TROPONINI 0.09* 0.09* 0.09*   BNP (last 3 results) No results for input(s): PROBNP in the last 8760 hours. HbA1C: No results for input(s): HGBA1C in the last 72 hours. CBG:  Recent Labs Lab 12/30/16 1214 12/30/16 1553 12/30/16 1954 12/30/16 2343 12/31/16 0333  GLUCAP 144* 92 128* 128* 124*   Lipid Profile: No results for input(s): CHOL, HDL, LDLCALC, TRIG, CHOLHDL, LDLDIRECT in the last 72 hours. Thyroid Function Tests: No results for input(s): TSH, T4TOTAL, FREET4, T3FREE, THYROIDAB in the last 72 hours. Anemia Panel: No results for input(s): VITAMINB12, FOLATE, FERRITIN, TIBC, IRON, RETICCTPCT in the last 72 hours. Sepsis Labs:  Recent Labs Lab 12/28/16 0647 12/28/16 0944  LATICACIDVEN 3.6* 4.2*    Recent Results (from the past 240 hour(s))  MRSA PCR Screening     Status: None   Collection Time: 12/28/16  5:22 AM  Result Value Ref Range Status   MRSA by PCR NEGATIVE NEGATIVE Final    Comment:        The GeneXpert MRSA Assay (FDA approved for NASAL specimens only), is one component of a comprehensive MRSA colonization surveillance program. It is not intended to diagnose MRSA infection nor to guide or monitor treatment for MRSA infections.          Radiology Studies: No results found.      Scheduled Meds: . sodium chloride   Intravenous Once  . feeding supplement (ENSURE ENLIVE)  237 mL Oral BID BM  . pantoprazole  40 mg Intravenous Q12H  . sodium chloride flush  10-40 mL Intracatheter Q12H   Continuous Infusions:   LOS: 6 days     Cordelia Poche, MD [facility] 01/03/2017, 3:04 PM Pager: [phone]  If 7PM-7AM, please  contact night-coverage [email or web address] Password [email or web address] 01/03/2017, 3:04 PM

## 2017-01-04 LAB — BASIC METABOLIC PANEL
ANION GAP: 14 (ref 5–15)
BUN: 42 mg/dL — ABNORMAL HIGH (ref 6–20)
CHLORIDE: 92 mmol/L — AB (ref 101–111)
CO2: 25 mmol/L (ref 22–32)
CREATININE: 6.33 mg/dL — AB (ref 0.61–1.24)
Calcium: 7.9 mg/dL — ABNORMAL LOW (ref 8.9–10.3)
GFR calc non Af Amer: 9 mL/min — ABNORMAL LOW (ref >60)
GFR, EST AFRICAN AMERICAN: 10 mL/min — AB (ref >60)
Glucose, Bld: 65 mg/dL (ref 65–99)
Potassium: 5.2 mmol/L — ABNORMAL HIGH (ref 3.5–5.1)
SODIUM: 131 mmol/L — AB (ref 135–145)

## 2017-01-04 LAB — GLUCOSE, CAPILLARY: GLUCOSE-CAPILLARY: 76 mg/dL (ref 65–99)

## 2017-01-04 LAB — CBC
HEMATOCRIT: 20.7 % — AB (ref 39.0–52.0)
HEMOGLOBIN: 7 g/dL — AB (ref 13.0–17.0)
MCH: 29 pg (ref 26.0–34.0)
MCHC: 33.8 g/dL (ref 30.0–36.0)
MCV: 85.9 fL (ref 78.0–100.0)
PLATELETS: 128 10*3/uL — AB (ref 150–400)
RBC: 2.41 MIL/uL — AB (ref 4.22–5.81)
RDW: 16.8 % — ABNORMAL HIGH (ref 11.5–15.5)
WBC: 10.6 10*3/uL — ABNORMAL HIGH (ref 4.0–10.5)

## 2017-01-04 LAB — PHOSPHORUS: PHOSPHORUS: 5.1 mg/dL — AB (ref 2.5–4.6)

## 2017-01-04 LAB — PREPARE RBC (CROSSMATCH)

## 2017-01-04 MED ORDER — NEPRO/CARBSTEADY PO LIQD
237.0000 mL | Freq: Three times a day (TID) | ORAL | Status: DC
  Administered 2017-01-05 – 2017-01-10 (×13): 237 mL via ORAL

## 2017-01-04 MED ORDER — SODIUM CHLORIDE 0.9 % IV SOLN
Freq: Once | INTRAVENOUS | Status: AC
  Administered 2017-01-04: 13:00:00 via INTRAVENOUS

## 2017-01-04 NOTE — Progress Notes (Signed)
Pt being transported to dialysis via bed by transporter. Ranelle Oyster, RN

## 2017-01-04 NOTE — Progress Notes (Signed)
Nutrition Follow-up  DOCUMENTATION CODES:   Severe malnutrition in context of chronic illness  INTERVENTION:   -D/c Ensure Enlive po BID, each supplement provides 350 kcal and 20 grams of protein -Nepro Shake po TID, each supplement provides 425 kcal and 19 grams protein  NUTRITION DIAGNOSIS:   Malnutrition related to chronic illness (pancreatic cancer with liver mets) as evidenced by severe depletion of muscle mass, severe depletion of body fat, energy intake < or equal to 75% for > or equal to 1 month, percent weight loss (11-19% weight loss within 6 months).  Ongoing  GOAL:   Patient will meet greater than or equal to 90% of their needs  Progressing  MONITOR:   Diet advancement, PO intake, Weight trends, Labs, I & O's  REASON FOR ASSESSMENT:   Malnutrition Screening Tool    ASSESSMENT:   56 yo male with PMH of pancreatic CA with liver mets (undergoing chemotherapy at [facility]) who was admitted on 4/3 with GIB and hyperkalemia. Transferred to [facility] from [facility] for HD.  4/9- CRRT d/c 4/9- transfer from ICU to floor 4/11- plan to transition to iHD  Pt in with nursing, consuming lunch tray at time of visit.   Pt advanced to a regular diet on 12/30/16. Meal completion minimal- PO: 0-25%. Noted pt had mainly liquids on meal tray.   Palliative care team following for goals of care.   Ensure supplements ordered on 01/02/17. Pt has accepted 50% of time they are offered. Due to high K and Phos levels and minimal intake, RD will switch to Nepro due to renal-friendliness and increased nutritional density.   Medications reviewed and include Creon.   Labs reviewed: Na: 131, K: 5.2, Phos: 5.1.   Diet Order:  Diet regular Room service appropriate? Yes; Fluid consistency: Thin  Skin:  Reviewed, no issues  Last BM:  01/03/17  Height:   Ht Readings from Last 1 Encounters:  12/28/16 5\' 10"  (1.778 m)    Weight:   Wt Readings from Last 1 Encounters:  01/04/17 197 lb 8.5 oz (89.6 kg)     Ideal Body Weight:  75.5 kg  BMI:  Body mass index is 28.34 kg/m.  Estimated Nutritional Needs:   Kcal:  2400-2600  Protein:  120-135 grams  Fluid:  per MD  EDUCATION NEEDS:   No education needs identified at this time  Marlan Steward A. Jimmye Norman, RD, LDN, CDE Pager: [phone] After hours Pager: [phone]

## 2017-01-04 NOTE — Progress Notes (Addendum)
PROGRESS NOTE   Henry Barton  DJS:[id number]    DOB: [birth year]    DOA: 12/27/2016  PCP: Pcp Not In System   I have briefly reviewed patients previous medical records in [facility].  Brief Narrative:  56 year old male with a PMH of pancreatic cancer with metastases to liver, currently undergoing chemotherapy under the care of Dr. Berneice Gandy at [facility] (diagnosed 11/10/16 by liver biopsy, started palliative chemotherapy 11/29/16), HTN, remote history of PUD, anemia requiring outpatient transfusions, presented to ED for/3/18 with 3 days history of lack of energy, blood in stools and coffee-ground emesis. He was admitted by CCM to ICU for upper GI bleed, hemorrhagic shock and acute renal failure requiring CRRT. After stabilization, care was transferred to telemetry and TRH on 4/10. Nephrology consulting.   Assessment & Plan:   Active Problems:   GI bleed   Acute renal failure (ARF) (HCC)   Pancreatic cancer metastasized to liver [facility])   Palliative care by specialist   DNR (do not resuscitate) discussion   DNR (do not resuscitate)   AKI (acute kidney injury) ([place])   Severe protein-calorie malnutrition ([place])   Malignant neoplasm of pancreas ([place])   1. Acute upper GI bleed: Likely related to metastatic pancreatic cancer and mass erosion into GI tract +/- PUD. [facility] was consulted and recommended no EGD due to increased multifactorial risk but continue PPI twice daily. Overall poor prognosis. 2. Acute blood loss anemia: Secondary to GI bleed. Has had multiple blood transfusions thus far. Hemoglobin down to 7. Discussed with nephrology and will transfuse a unit across dialysis. Transfuse to keep hemoglobin greater than 7 g per DL. 3. Thrombocytopenia: Stable. 4. Acute kidney injury: Initially was on CRRT but was transitioned to intermittent HD on 4/9. Creatinine continues to rise. Volume overloaded. Potassium 5.2. Discussed with Dr. Florene Glen and plans for HD on 4/11.  5. Metastatic pancreatic  cancer/ jaundice: Outpatient follow-up with oncologist at [facility] upon discharge. As per palliative care follow-up with patient, they wish full scope of treatment, hopeful to resume chemotherapy once he has improved from acute illness. 6. Severe malnutrition in the context of chronic illness: Per dietitian. 7. Leukocytosis: Suspect stress de-margination. Resolved. 8. Hypoglycemia: Monitor CBGs 9. Depression: 10. Chronic pain   DVT prophylaxis: SCDs Code Status: DO NOT RESUSCITATE Family Communication: Discussed with spouse at bedside Disposition: DC home when medically stable.   Consultants:  [facility]-signed off [place] off Nephrology   Procedures:  HD PRBC transfusions  Antimicrobials:  None   Subjective: Seen this morning. Spouse at bedside. States that he feels "okay". Reports dark brown stools and denies melena. Indicate swelling of his abdomen, legs. Denies chest pain or dyspnea.   ROS: Negative.  Objective:  Vitals:   01/03/17 1420 01/03/17 2204 01/04/17 0607 01/04/17 1426  BP: 109/61 (!) 106/56 (!) 99/56 (!) 100/53  Pulse: (!) 103 (!) 113 (!) 104 (!) 106  Resp: 15 20 18 18   Temp: 99.4 F (37.4 C) 99.9 F (37.7 C) 98.1 F (36.7 C) (!) 100.9 F (38.3 C)  TempSrc:  Oral Oral Oral  SpO2: 100% 99% 98% 99%  Weight:   89.6 kg (197 lb 8.5 oz)   Height:        Examination:  General exam: Pleasant middle-aged male, chronically ill looking, lying comfortably propped up in bed. Scleral icterus + + Respiratory system: Reduced breath sounds in the bases but otherwise clear to auscultation. Respiratory effort normal. Right side neck HD catheter.Port-A-Cath +. Cardiovascular system: S1 &  S2 heard, RRR. No JVD, murmurs, rubs, gallops or clicks. 2+ pedal edema. Telemetry: ST in the 100-110's Gastrointestinal system: Abdomen is distended, not tense, soft, nontender. Epigastric/RUQ masses felt. Normal bowel sounds heard. Possible ascites. Central nervous system: Alert and  oriented. No focal neurological deficits. Extremities: Symmetric 5 x 5 power. Skin: No rashes, lesions or ulcers Psychiatry: Judgement and insight appear normal. Mood & affect appropriate.     Data Reviewed: I have personally reviewed following labs and imaging studies  CBC:  Recent Labs Lab 12/31/16 1739  01/01/17 0350  01/02/17 0500 01/02/17 0506 01/02/17 0512 01/03/17 0417 01/04/17 0433  WBC 17.0*  --  18.5*  --  15.0*  --   --  19.2* 10.6*  HGB 7.3*  < > 7.1*  < > 7.4* 8.2* 7.8* 7.3* 7.0*  HCT 20.6*  < > 21.1*  < > 21.8* 24.0* 23.0* 21.5* 20.7*  MCV 83.7  --  85.1  --  86.2  --   --  86.3 85.9  PLT 112*  --  114*  --  104*  --   --  116* 128*  < > = values in this interval not displayed. Basic Metabolic Panel:  Recent Labs Lab 12/30/16 0453  12/31/16 0346  01/01/17 0350  01/01/17 0411  01/01/17 1600  01/02/17 0500 01/02/17 0506 01/02/17 0512 01/03/17 0417 01/04/17 0433  NA 134*  < > 134*  < > 136  136  < > 136  < > 137  < > 136 139 137 134* 131*  K 4.5  < > 4.5  < > 4.5  4.6  < > 4.3  < > 4.4  < > 3.9 3.8 4.0 4.9 5.2*  CL 89*  < > 89*  < > 93*  93*  < > 91*  --  94*  --  95*  --   --  94* 92*  CO2 27  < > 26  < > 30  30  --   --   --  29  --  26  --   --  27 25  GLUCOSE 130*  < > 133*  < > 111*  112*  < > 155*  --  131*  --  164*  --   --  65 65  BUN 54*  < > 32*  < > 20  20  < > 18  --  17  --  13  --   --  21* 42*  CREATININE 4.60*  < > 3.58*  < > 3.17*  3.14*  < > 2.70*  --  3.01*  --  2.73*  --   --  4.63* 6.33*  CALCIUM 9.9  < > 11.3*  < > 8.5*  8.5*  --   --   --  7.6*  --  7.2*  --   --  7.7* 7.9*  MG 2.0  --  1.8  --  1.9  --   --   --   --   --  1.9  --   --   --   --   PHOS 5.4*  < > 4.7*  < > 3.4  3.4  --   --   --  3.1  --  2.3*  --   --  3.9 5.1*  < > = values in this interval not displayed. Liver Function Tests:  Recent Labs Lab 12/31/16 0346 12/31/16 1738 01/01/17 0350 01/01/17 1600 01/02/17 0500  AST  --   --   --   --  192*    ALT  --   --   --   --  84*  ALKPHOS  --   --   --   --  868*  BILITOT  --   --   --   --  15.3*  PROT  --   --   --   --  4.7*  ALBUMIN <1.0* <1.0* <1.0* <1.0* <1.0*   Coagulation Profile:  Recent Labs Lab 12/30/16 0814  INR 1.34   Cardiac Enzymes:  Recent Labs Lab 12/28/16 1829  TROPONINI 0.09*   CBG:  Recent Labs Lab 12/30/16 1214 12/30/16 1553 12/30/16 1954 12/30/16 2343 12/31/16 0333  GLUCAP 144* 92 128* 128* 124*    Recent Results (from the past 240 hour(s))  MRSA PCR Screening     Status: None   Collection Time: 12/28/16  5:22 AM  Result Value Ref Range Status   MRSA by PCR NEGATIVE NEGATIVE Final    Comment:        The GeneXpert MRSA Assay (FDA approved for NASAL specimens only), is one component of a comprehensive MRSA colonization surveillance program. It is not intended to diagnose MRSA infection nor to guide or monitor treatment for MRSA infections.          Radiology Studies: No results found.      Scheduled Meds: . sodium chloride   Intravenous Once  . sodium chloride   Intravenous Once  . feeding supplement (NEPRO CARB STEADY)  237 mL Oral TID WC  . lipase/protease/amylase  24,000 Units Oral TID AC & HS  . pantoprazole  40 mg Intravenous Q12H  . sodium chloride flush  10-40 mL Intracatheter Q12H   Continuous Infusions:   LOS: 7 days     Topher Buenaventura, MD, FACP, FHM. [facility] Pager [phone]  If 7PM-7AM, please contact night-coverage [email or web address] Password [facility] 01/04/2017, 5:19 PM

## 2017-01-04 NOTE — Progress Notes (Signed)
Assessment/Plan: 1 AKI non-oliguric  HD today. 2 GIB w/ slow ooze  3 Metastatic pnacreatic Ca 4 Anemia  PRBCs on HD today  Subjective: Interval History: Minimal UOP  Objective: Vital signs in last 24 hours: Temp:  [98.1 F (36.7 C)-100.9 F (38.3 C)] 100.9 F (38.3 C) (04/11 1426) Pulse Rate:  [104-113] 106 (04/11 1426) Resp:  [18-20] 18 (04/11 1426) BP: (99-106)/(53-56) 100/53 (04/11 1426) SpO2:  [98 %-99 %] 99 % (04/11 1426) Weight:  [89.6 kg (197 lb 8.5 oz)] 89.6 kg (197 lb 8.5 oz) (04/11 0607) Weight change: -0.212 kg (-7.5 oz)  Intake/Output from previous day: 04/10 0701 - 04/11 0700 In: 320 [P.O.:220; I.V.:100] Out: 150 [Urine:150] Intake/Output this shift: No intake/output data recorded.  General appearance: alert and cooperative GI: distended abd with mass RUQ Extremities: edema 2+  Scleral icterus  Lab Results:  Recent Labs  01/03/17 0417 01/04/17 0433  WBC 19.2* 10.6*  HGB 7.3* 7.0*  HCT 21.5* 20.7*  PLT 116* 128*   BMET:  Recent Labs  01/03/17 0417 01/04/17 0433  NA 134* 131*  K 4.9 5.2*  CL 94* 92*  CO2 27 25  GLUCOSE 65 65  BUN 21* 42*  CREATININE 4.63* 6.33*  CALCIUM 7.7* 7.9*   No results for input(s): PTH in the last 72 hours. Iron Studies: No results for input(s): IRON, TIBC, TRANSFERRIN, FERRITIN in the last 72 hours. Studies/Results: No results found.  Scheduled: . sodium chloride   Intravenous Once  . sodium chloride   Intravenous Once  . feeding supplement (ENSURE ENLIVE)  237 mL Oral BID BM  . lipase/protease/amylase  24,000 Units Oral TID AC & HS  . pantoprazole  40 mg Intravenous Q12H  . sodium chloride flush  10-40 mL Intracatheter Q12H    LOS: 7 days   Leighton Brickley C 01/04/2017,2:31 PM

## 2017-01-04 NOTE — Progress Notes (Signed)
Notified Baltazar Najjar, NP that pt states he doesn't want to be a DNR. Pt states he wants all measures to be done but no intubation. Will continue to monitor pt. Ranelle Oyster, RN

## 2017-01-05 LAB — CBC
HCT: 21.6 % — ABNORMAL LOW (ref 39.0–52.0)
HEMOGLOBIN: 7.3 g/dL — AB (ref 13.0–17.0)
MCH: 28.9 pg (ref 26.0–34.0)
MCHC: 33.8 g/dL (ref 30.0–36.0)
MCV: 85.4 fL (ref 78.0–100.0)
PLATELETS: 129 10*3/uL — AB (ref 150–400)
RBC: 2.53 MIL/uL — ABNORMAL LOW (ref 4.22–5.81)
RDW: 16 % — ABNORMAL HIGH (ref 11.5–15.5)
WBC: 11.1 10*3/uL — ABNORMAL HIGH (ref 4.0–10.5)

## 2017-01-05 LAB — BPAM RBC
BLOOD PRODUCT EXPIRATION DATE: 201804262359
Blood Product Expiration Date: 201804272359
ISSUE DATE / TIME: 201804081031
ISSUE DATE / TIME: 201804112340
Unit Type and Rh: 6200
Unit Type and Rh: 6200

## 2017-01-05 LAB — HEPATIC FUNCTION PANEL
ALK PHOS: 876 U/L — AB (ref 38–126)
ALT: 59 U/L (ref 17–63)
AST: 126 U/L — ABNORMAL HIGH (ref 15–41)
Albumin: <1 g/dL — ABNORMAL LOW (ref 3.5–5.0)
BILIRUBIN DIRECT: 8.4 mg/dL — AB (ref 0.1–0.5)
BILIRUBIN INDIRECT: 6.2 mg/dL — AB (ref 0.3–0.9)
Total Bilirubin: 14.6 mg/dL — ABNORMAL HIGH (ref 0.3–1.2)
Total Protein: 4.4 g/dL — ABNORMAL LOW (ref 6.5–8.1)

## 2017-01-05 LAB — BASIC METABOLIC PANEL
Anion gap: 12 (ref 5–15)
BUN: 37 mg/dL — AB (ref 6–20)
CHLORIDE: 96 mmol/L — AB (ref 101–111)
CO2: 26 mmol/L (ref 22–32)
CREATININE: 6.25 mg/dL — AB (ref 0.61–1.24)
Calcium: 7.7 mg/dL — ABNORMAL LOW (ref 8.9–10.3)
GFR calc Af Amer: 10 mL/min — ABNORMAL LOW (ref >60)
GFR calc non Af Amer: 9 mL/min — ABNORMAL LOW (ref >60)
Glucose, Bld: 77 mg/dL (ref 65–99)
Potassium: 4.9 mmol/L (ref 3.5–5.1)
Sodium: 134 mmol/L — ABNORMAL LOW (ref 135–145)

## 2017-01-05 LAB — TYPE AND SCREEN
ABO/RH(D): A POS
Antibody Screen: NEGATIVE
UNIT DIVISION: 0
UNIT DIVISION: 0

## 2017-01-05 LAB — GLUCOSE, CAPILLARY
GLUCOSE-CAPILLARY: 83 mg/dL (ref 65–99)
GLUCOSE-CAPILLARY: 109 mg/dL — AB (ref 65–99)
Glucose-Capillary: 132 mg/dL — ABNORMAL HIGH (ref 65–99)
Glucose-Capillary: 133 mg/dL — ABNORMAL HIGH (ref 65–99)

## 2017-01-05 MED ORDER — HEPARIN SODIUM (PORCINE) 1000 UNIT/ML DIALYSIS: 1000.0000 [IU] | INTRAMUSCULAR | Status: DC | PRN

## 2017-01-05 MED ORDER — SODIUM CHLORIDE 0.9 % IV SOLN: 100.0000 mL | INTRAVENOUS | Status: DC | PRN

## 2017-01-05 MED ORDER — LIDOCAINE-PRILOCAINE 2.5-2.5 % EX CREA: 1.0000 "application " | TOPICAL_CREAM | CUTANEOUS | Status: DC | PRN

## 2017-01-05 MED ORDER — PANTOPRAZOLE SODIUM 40 MG PO TBEC
40.0000 mg | DELAYED_RELEASE_TABLET | Freq: Two times a day (BID) | ORAL | Status: DC
  Administered 2017-01-05 – 2017-01-15 (×17): 40 mg via ORAL
  Filled 2017-01-05 (×18): qty 1

## 2017-01-05 MED ORDER — ALTEPLASE 2 MG IJ SOLR: 2.0000 mg | Freq: Once | INTRAMUSCULAR | Status: DC | PRN

## 2017-01-05 MED ORDER — PENTAFLUOROPROP-TETRAFLUOROETH EX AERO: 1.0000 "application " | INHALATION_SPRAY | CUTANEOUS | Status: DC | PRN

## 2017-01-05 MED ORDER — LIDOCAINE HCL (PF) 1 % IJ SOLN: 5.0000 mL | INTRAMUSCULAR | Status: DC | PRN

## 2017-01-05 NOTE — Progress Notes (Signed)
Explained to patient importance of using the urinal to measure his urine output.

## 2017-01-05 NOTE — Progress Notes (Signed)
Notified Baltazar Najjar, NP that pt's temp is 102.3 this morning. Giving tylenol as ordered. Will continue to monitor pt. Hassan Buckler

## 2017-01-05 NOTE — Progress Notes (Signed)
Assessment/Plan: 1 AKI non-oliguric  HD prn 2 GIB w/ slow ooze  3 Metastatic pnacreatic Ca 4 Anemia  PRBCs on HD today 5 Swollen left leg--at risk for DVT, doppler   Subjective: Interval History: He reports UOP but none recorded  Objective: Vital signs in last 24 hours: Temp:  [98.4 F (36.9 C)-102.3 F (39.1 C)] 99.6 F (37.6 C) (04/12 0653) Pulse Rate:  [97-109] 109 (04/12 0541) Resp:  [16-18] 16 (04/12 0541) BP: (87-108)/(50-74) 99/50 (04/12 0541) SpO2:  [95 %-100 %] 100 % (04/12 0541) Weight:  [85.5 kg (188 lb 7.9 oz)-87.7 kg (193 lb 5.5 oz)] 85.5 kg (188 lb 7.9 oz) (04/12 0300) Weight change: -1.9 kg (-4 lb 3 oz)  Intake/Output from previous day: 04/11 0701 - 04/12 0700 In: 1015 [P.O.:680; Blood:335] Out: 2265  Intake/Output this shift: Total I/O In: 65 [Other:40; IV Piggyback:25] Out: -   General appearance: alert and cooperative Eyes: scleral icterus GI: RUQ mass Extremities: edema 2+ on right, 1+ on left  Lab Results:  Recent Labs  01/04/17 0433 01/05/17 1044  WBC 10.6* 11.1*  HGB 7.0* 7.3*  HCT 20.7* 21.6*  PLT 128* 129*   BMET:  Recent Labs  01/04/17 0433 01/05/17 1044  NA 131* 134*  K 5.2* 4.9  CL 92* 96*  CO2 25 26  GLUCOSE 65 77  BUN 42* 37*  CREATININE 6.33* 6.25*  CALCIUM 7.9* 7.7*   No results for input(s): PTH in the last 72 hours. Iron Studies: No results for input(s): IRON, TIBC, TRANSFERRIN, FERRITIN in the last 72 hours. Studies/Results: No results found.  Scheduled: . feeding supplement (NEPRO CARB STEADY)  237 mL Oral TID WC  . lipase/protease/amylase  24,000 Units Oral TID AC & HS  . pantoprazole  40 mg Oral BID  . sodium chloride flush  10-40 mL Intracatheter Q12H     LOS: 8 days   Niyati Heinke C 01/05/2017,12:16 PM

## 2017-01-05 NOTE — Progress Notes (Signed)
Pt returned from dialysis via bed with transporter. Will continue to monitor. Ranelle Oyster, RN

## 2017-01-05 NOTE — Progress Notes (Signed)
Patient ID: Henry Barton, male   DOB: December 18, 1960, 56 y.o.   MRN: [id number]  This NP visited patient at the bedside as a follow up and continued support to paient and family.  Wife at bedside.  Patient remains open to offered and  available medical interventions to prolong life.   Discussed with patient and his wife the importance of continued conversation with family and their  medical providers regarding overall plan of care and treatment options and the intentions of those treatments,  ensuring decisions are within the context of the patients values and GOCs.  Questions and concerns addressed  Minister arrived in room, I was invited into their prayer circle.  Patient and his family firmly based and supported  in their spirtuality.  PMT will continue to support holistically  Time in  1430         Time out    1455     Total time 25 minutes spent on unit  Greater than 50% of the time was spent in counseling and coordination of care  Wadie Lessen NP  Palliative Medicine Team Team Phone # [phone] Pager [phone]

## 2017-01-05 NOTE — Progress Notes (Signed)
PROGRESS NOTE   Henry Barton  RSW:[id number]    DOB: Mar 28, 1961    DOA: 12/27/2016  PCP: Pcp Not In System   I have briefly reviewed patients previous medical records in  [facility]  Brief Narrative:  56 year old male with a PMH of pancreatic cancer with metastases to liver, currently undergoing chemotherapy under the care of Dr. Berneice Gandy at [facility] (diagnosed 11/10/16 by liver biopsy, started palliative chemotherapy 11/29/16), HTN, remote history of PUD, anemia requiring outpatient transfusions, presented to ED for/3/18 with 3 days history of lack of energy, blood in stools and coffee-ground emesis. He was admitted by CCM to ICU for upper GI bleed, hemorrhagic shock and acute renal failure requiring CRRT. After stabilization, care was transferred to telemetry and TRH on 4/10. Nephrology consulting.   Assessment & Plan:   Active Problems:   GI bleed   Acute renal failure (ARF) (HCC)   Pancreatic cancer metastasized to liver [facility])   Palliative care by specialist   DNR (do not resuscitate) discussion   DNR (do not resuscitate)   AKI (acute kidney injury) ([place])   Severe protein-calorie malnutrition ([place])   Malignant neoplasm of pancreas ([place])   1. Acute upper GI bleed: Likely related to metastatic pancreatic cancer and mass erosion into GI tract +/- PUD. [place] [facility] was consulted and recommended no EGD due to increased multifactorial risk but continue PPI twice daily. Overall poor prognosis. 2. Acute blood loss anemia: Secondary to GI bleed. Has had multiple blood transfusions thus far. Hemoglobin down to 7. Discussed with nephrology and will transfuse a unit across dialysis. Transfuse to keep hemoglobin greater than 7 g per DL. 3. Thrombocytopenia: Stable. 4. Acute kidney injury: Initially was on CRRT but was transitioned to intermittent HD on 4/9. Creatinine continues to rise. Volume overloaded. Potassium 5.2. Discussed with Dr. Florene Glen and plans for HD on 4/11.  5. Metastatic pancreatic  cancer/ jaundice: Outpatient follow-up with oncologist at [facility] upon discharge. As per palliative care follow-up with patient, they wish full scope of treatment, hopeful to resume chemotherapy once he has improved from acute illness. 6. Severe malnutrition in the context of chronic illness: Per dietitian. 7. Leukocytosis: Suspect stress de-margination. Resolved. 8. Hypoglycemia: Monitor CBGs 9. Depression: 10. Chronic pain   DVT prophylaxis: SCDs Code Status: DO NOT RESUSCITATE Family Communication: Discussed with spouse at bedside Disposition: DC home when medically stable.   Consultants:  [facility]-signed off [place] off Nephrology   Procedures:  HD PRBC transfusions  Antimicrobials:  None   Subjective: No new complaints reported today.  ROS: Negative.  Objective:  Vitals:   01/05/17 0541 01/05/17 0653 01/05/17 1451 01/05/17 1512  BP: (!) 99/50  (!) 80/51 (!) 92/52  Pulse: (!) 109  (!) 102   Resp: 16  16   Temp: (!) 102.3 F (39.1 C) 99.6 F (37.6 C) 98 F (36.7 C)   TempSrc: Oral Oral Oral   SpO2: 100%  100%   Weight:      Height:        Examination:  General exam: Pleasant middle-aged male, chronically ill looking, lying comfortably propped up in bed. Scleral icterus + + Respiratory system: Reduced breath sounds in the bases but otherwise clear to auscultation. Respiratory effort normal. Right side neck HD catheter.Port-A-Cath +. Cardiovascular system: S1 & S2 heard, RRR. No JVD, murmurs, rubs, gallops or clicks. 2+ pedal edema. Telemetry: ST in the 100-110's Gastrointestinal system: Abdomen is distended, not tense, soft, nontender. Epigastric/RUQ masses felt. Normal bowel sounds heard.  Possible ascites. Central nervous system: Alert and oriented. No focal neurological deficits. Extremities: Symmetric 5 x 5 power. Skin: No rashes, lesions or ulcers Psychiatry: Judgement and insight appear normal. Mood & affect appropriate.   Data Reviewed: I have  personally reviewed following labs and imaging studies  CBC:  Recent Labs Lab 01/01/17 0350  01/02/17 0500 01/02/17 0506 01/02/17 0512 01/03/17 0417 01/04/17 0433 01/05/17 1044  WBC 18.5*  --  15.0*  --   --  19.2* 10.6* 11.1*  HGB 7.1*  < > 7.4* 8.2* 7.8* 7.3* 7.0* 7.3*  HCT 21.1*  < > 21.8* 24.0* 23.0* 21.5* 20.7* 21.6*  MCV 85.1  --  86.2  --   --  86.3 85.9 85.4  PLT 114*  --  104*  --   --  116* 128* 129*  < > = values in this interval not displayed. Basic Metabolic Panel:  Recent Labs Lab 12/30/16 0453  12/31/16 0346  01/01/17 0350  01/01/17 1600  01/02/17 0500 01/02/17 0506 01/02/17 0512 01/03/17 0417 01/04/17 0433 01/05/17 1044  NA 134*  < > 134*  < > 136  136  < > 137  < > 136 139 137 134* 131* 134*  K 4.5  < > 4.5  < > 4.5  4.6  < > 4.4  < > 3.9 3.8 4.0 4.9 5.2* 4.9  CL 89*  < > 89*  < > 93*  93*  < > 94*  --  95*  --   --  94* 92* 96*  CO2 27  < > 26  < > 30  30  --  29  --  26  --   --  27 25 26   GLUCOSE 130*  < > 133*  < > 111*  112*  < > 131*  --  164*  --   --  65 65 77  BUN 54*  < > 32*  < > 20  20  < > 17  --  13  --   --  21* 42* 37*  CREATININE 4.60*  < > 3.58*  < > 3.17*  3.14*  < > 3.01*  --  2.73*  --   --  4.63* 6.33* 6.25*  CALCIUM 9.9  < > 11.3*  < > 8.5*  8.5*  --  7.6*  --  7.2*  --   --  7.7* 7.9* 7.7*  MG 2.0  --  1.8  --  1.9  --   --   --  1.9  --   --   --   --   --   PHOS 5.4*  < > 4.7*  < > 3.4  3.4  --  3.1  --  2.3*  --   --  3.9 5.1*  --   < > = values in this interval not displayed. Liver Function Tests:  Recent Labs Lab 12/31/16 1738 01/01/17 0350 01/01/17 1600 01/02/17 0500 01/05/17 1044  AST  --   --   --  192* 126*  ALT  --   --   --  84* 59  ALKPHOS  --   --   --  868* 876*  BILITOT  --   --   --  15.3* 14.6*  PROT  --   --   --  4.7* 4.4*  ALBUMIN <1.0* <1.0* <1.0* <1.0* <1.0*   Coagulation Profile:  Recent Labs Lab 12/30/16 0814  INR 1.34   Cardiac Enzymes: No results  for input(s): CKTOTAL, CKMB,  CKMBINDEX, TROPONINI in the last 168 hours. CBG:  Recent Labs Lab 12/30/16 2343 12/31/16 0333 01/04/17 2248 01/05/17 0722 01/05/17 1215  GLUCAP 128* 124* 76 83 109*    Recent Results (from the past 240 hour(s))  MRSA PCR Screening     Status: None   Collection Time: 12/28/16  5:22 AM  Result Value Ref Range Status   MRSA by PCR NEGATIVE NEGATIVE Final    Comment:        The GeneXpert MRSA Assay (FDA approved for NASAL specimens only), is one component of a comprehensive MRSA colonization surveillance program. It is not intended to diagnose MRSA infection nor to guide or monitor treatment for MRSA infections.      Radiology Studies: No results found.   Scheduled Meds: . feeding supplement (NEPRO CARB STEADY)  237 mL Oral TID WC  . lipase/protease/amylase  24,000 Units Oral TID AC & HS  . pantoprazole  40 mg Oral BID  . sodium chloride flush  10-40 mL Intracatheter Q12H   Continuous Infusions:   LOS: 8 days    Velvet Bathe, MD, FACP, [facility] Pager [phone]  If 7PM-7AM, please contact night-coverage [email or web address] Password [email or web address] 01/05/2017, 5:05 PM

## 2017-01-06 ENCOUNTER — Encounter (HOSPITAL_COMMUNITY): Payer: BLUE CROSS/BLUE SHIELD

## 2017-01-06 LAB — RENAL FUNCTION PANEL
Anion gap: 15 (ref 5–15)
BUN: 53 mg/dL — ABNORMAL HIGH (ref 6–20)
CALCIUM: 8 mg/dL — AB (ref 8.9–10.3)
CO2: 23 mmol/L (ref 22–32)
Chloride: 93 mmol/L — ABNORMAL LOW (ref 101–111)
Creatinine, Ser: 8.18 mg/dL — ABNORMAL HIGH (ref 0.61–1.24)
GFR calc Af Amer: 8 mL/min — ABNORMAL LOW (ref >60)
GFR calc non Af Amer: 7 mL/min — ABNORMAL LOW (ref >60)
GLUCOSE: 122 mg/dL — AB (ref 65–99)
PHOSPHORUS: 6 mg/dL — AB (ref 2.5–4.6)
POTASSIUM: 4.8 mmol/L (ref 3.5–5.1)
SODIUM: 131 mmol/L — AB (ref 135–145)

## 2017-01-06 LAB — CBC
HEMATOCRIT: 22.8 % — AB (ref 39.0–52.0)
HEMOGLOBIN: 7.7 g/dL — AB (ref 13.0–17.0)
MCH: 29.1 pg (ref 26.0–34.0)
MCHC: 33.8 g/dL (ref 30.0–36.0)
MCV: 86 fL (ref 78.0–100.0)
Platelets: 167 10*3/uL (ref 150–400)
RBC: 2.65 MIL/uL — ABNORMAL LOW (ref 4.22–5.81)
RDW: 16.2 % — AB (ref 11.5–15.5)
WBC: 10.2 10*3/uL (ref 4.0–10.5)

## 2017-01-06 LAB — GLUCOSE, CAPILLARY
Glucose-Capillary: 59 mg/dL — ABNORMAL LOW (ref 65–99)
Glucose-Capillary: 72 mg/dL (ref 65–99)
Glucose-Capillary: 111 mg/dL — ABNORMAL HIGH (ref 65–99)
Glucose-Capillary: 113 mg/dL — ABNORMAL HIGH (ref 65–99)

## 2017-01-06 MED ORDER — ALTEPLASE 2 MG IJ SOLR: 2.0000 mg | Freq: Once | INTRAMUSCULAR | Status: DC | PRN

## 2017-01-06 MED ORDER — SODIUM CHLORIDE 0.9 % IV SOLN: 100.0000 mL | INTRAVENOUS | Status: DC | PRN

## 2017-01-06 MED ORDER — LIDOCAINE-PRILOCAINE 2.5-2.5 % EX CREA: 1.0000 "application " | TOPICAL_CREAM | CUTANEOUS | Status: DC | PRN

## 2017-01-06 MED ORDER — HEPARIN SODIUM (PORCINE) 1000 UNIT/ML DIALYSIS: 20.0000 [IU]/kg | INTRAMUSCULAR | Status: DC | PRN

## 2017-01-06 MED ORDER — HEPARIN SODIUM (PORCINE) 1000 UNIT/ML DIALYSIS: 1000.0000 [IU] | INTRAMUSCULAR | Status: DC | PRN

## 2017-01-06 MED ORDER — PENTAFLUOROPROP-TETRAFLUOROETH EX AERO: 1.0000 "application " | INHALATION_SPRAY | CUTANEOUS | Status: DC | PRN

## 2017-01-06 MED ORDER — LIDOCAINE HCL (PF) 1 % IJ SOLN: 5.0000 mL | INTRAMUSCULAR | Status: DC | PRN

## 2017-01-06 NOTE — Progress Notes (Addendum)
Hypoglycemic Event  CBG: 59  Treatment: 15 GM carbohydrate snack  Symptoms: None  Follow-up CBG: Time:0811 CBG Result: 72  Possible Reasons for Event: Unknown  Comments/MD notified:Text Dr. Lynnda Child

## 2017-01-06 NOTE — Progress Notes (Addendum)
PROGRESS NOTE   Henry Barton  DEY:[id number]    DOB: 15-Apr-1961    DOA: 12/27/2016  PCP: Pcp Not In System   I have briefly reviewed patients previous medical records in [facility]  Brief Narrative:  56 year old male with a PMH of pancreatic cancer with metastases to liver, currently undergoing chemotherapy under the care of Dr. Berneice Gandy at [facility] (diagnosed 11/10/16 by liver biopsy, started palliative chemotherapy 11/29/16), HTN, remote history of PUD, anemia requiring outpatient transfusions, presented to ED for/3/18 with 3 days history of lack of energy, blood in stools and coffee-ground emesis. He was admitted by CCM to ICU for upper GI bleed, hemorrhagic shock and acute renal failure requiring CRRT. After stabilization, care was transferred to telemetry and TRH on 4/10. Nephrology consulting.   Assessment & Plan:   Active Problems:   GI bleed   Acute renal failure (ARF) (HCC)   Pancreatic cancer metastasized to liver [facility])   Palliative care by specialist   DNR (do not resuscitate) discussion   DNR (do not resuscitate)   AKI (acute kidney injury) ([place])   Severe protein-calorie malnutrition ([place])   Malignant neoplasm of pancreas ([place])   1. Acute upper GI bleed: Likely related to metastatic pancreatic cancer and mass erosion into GI tract +/- PUD. [place] [facility] was consulted and recommended no EGD due to increased multifactorial risk but continue PPI twice daily. Overall poor prognosis. 2. Acute blood loss anemia: Secondary to GI bleed. Has had multiple blood transfusions thus far. Hemoglobin down to 7. Discussed with nephrology and will transfuse a unit across dialysis. Transfuse to keep hemoglobin greater than 7 g per DL.We'll continue to monitor 3. Thrombocytopenia: Stable. 4. Acute kidney injury: Nephrologist on board and assisting. Hospital stay currently complicated by worsening renal function  5. Metastatic pancreatic cancer/ jaundice: Outpatient follow-up with oncologist at [facility]  upon discharge. As per palliative care follow-up with patient, they wish full scope of treatment, hopeful to resume chemotherapy once he has improved from acute illness. 6. Severe malnutrition in the context of chronic illness: Per dietitian. 7. Leukocytosis: Suspect stress de-margination. Resolved. 8. Hypoglycemia: Monitor CBGs 9. Depression: 10. Chronic pain   DVT prophylaxis: SCDs Code Status: DO NOT RESUSCITATE Family Communication: Discussed with spouse at bedside Disposition: DC once cleared for discharge by nephrology   Consultants:  [facility]-signed off [place] off Nephrology   Procedures:  HD PRBC transfusions  Antimicrobials:  None   Subjective: No new complaints reported today.  ROS: Negative.  Objective:  Vitals:   01/06/17 1645 01/06/17 1700 01/06/17 1724 01/06/17 1815  BP: (!) 112/58 (!) 100/59 111/69 (!) 91/47  Pulse: (!) 112 (!) 113 (!) 114 (!) 119  Resp:   14 18  Temp:   99.6 F (37.6 C) (!) 101.2 F (38.4 C)  TempSrc:   Oral Axillary  SpO2:   96% 96%  Weight:   86.8 kg (191 lb 5.8 oz)   Height:        Examination:  General exam: Pleasant middle-aged male, chronically ill looking, lying comfortably propped up in bed. Scleral icterus + + Respiratory system: Reduced breath sounds in the bases but otherwise clear to auscultation. Respiratory effort normal. Right side neck HD catheter.Port-A-Cath +. Cardiovascular system: S1 & S2 heard, RRR. No JVD, murmurs, rubs, gallops or clicks. 2+ pedal edema.  Gastrointestinal system: Abdomen is distended, not tense, soft, nontender. Epigastric/RUQ masses felt. Normal bowel sounds heard. Possible ascites. Central nervous system: Alert and oriented. No focal neurological deficits.  Extremities: Symmetric 5 x 5 power. Skin: No rashes, lesions or ulcers Psychiatry: Judgement and insight appear normal. Mood & affect appropriate.   Data Reviewed: I have personally reviewed following labs and imaging  studies  CBC:  Recent Labs Lab 01/02/17 0500  01/02/17 0512 01/03/17 0417 01/04/17 0433 01/05/17 1044 01/06/17 1453  WBC 15.0*  --   --  19.2* 10.6* 11.1* 10.2  HGB 7.4*  < > 7.8* 7.3* 7.0* 7.3* 7.7*  HCT 21.8*  < > 23.0* 21.5* 20.7* 21.6* 22.8*  MCV 86.2  --   --  86.3 85.9 85.4 86.0  PLT 104*  --   --  116* 128* 129* 167  < > = values in this interval not displayed. Basic Metabolic Panel:  Recent Labs Lab 12/31/16 0346  01/01/17 0350  01/01/17 1600  01/02/17 0500  01/02/17 0512 01/03/17 0417 01/04/17 0433 01/05/17 1044 01/06/17 1453  NA 134*  < > 136  136  < > 137  < > 136  < > 137 134* 131* 134* 131*  K 4.5  < > 4.5  4.6  < > 4.4  < > 3.9  < > 4.0 4.9 5.2* 4.9 4.8  CL 89*  < > 93*  93*  < > 94*  --  95*  --   --  94* 92* 96* 93*  CO2 26  < > 30  30  --  29  --  26  --   --  27 25 26 23   GLUCOSE 133*  < > 111*  112*  < > 131*  --  164*  --   --  65 65 77 122*  BUN 32*  < > 20  20  < > 17  --  13  --   --  21* 42* 37* 53*  CREATININE 3.58*  < > 3.17*  3.14*  < > 3.01*  --  2.73*  --   --  4.63* 6.33* 6.25* 8.18*  CALCIUM 11.3*  < > 8.5*  8.5*  --  7.6*  --  7.2*  --   --  7.7* 7.9* 7.7* 8.0*  MG 1.8  --  1.9  --   --   --  1.9  --   --   --   --   --   --   PHOS 4.7*  < > 3.4  3.4  --  3.1  --  2.3*  --   --  3.9 5.1*  --  6.0*  < > = values in this interval not displayed. Liver Function Tests:  Recent Labs Lab 01/01/17 0350 01/01/17 1600 01/02/17 0500 01/05/17 1044 01/06/17 1453  AST  --   --  192* 126*  --   ALT  --   --  84* 59  --   ALKPHOS  --   --  868* 876*  --   BILITOT  --   --  15.3* 14.6*  --   PROT  --   --  4.7* 4.4*  --   ALBUMIN <1.0* <1.0* <1.0* <1.0* <1.0*   Coagulation Profile: No results for input(s): INR, PROTIME in the last 168 hours. Cardiac Enzymes: No results for input(s): CKTOTAL, CKMB, CKMBINDEX, TROPONINI in the last 168 hours. CBG:  Recent Labs Lab 01/05/17 1714 01/05/17 2149 01/06/17 0751 01/06/17 0811  01/06/17 1212  GLUCAP 132* 133* 59* 72 111*    Recent Results (from the past 240 hour(s))  MRSA PCR Screening  Status: None   Collection Time: 12/28/16  5:22 AM  Result Value Ref Range Status   MRSA by PCR NEGATIVE NEGATIVE Final    Comment:        The GeneXpert MRSA Assay (FDA approved for NASAL specimens only), is one component of a comprehensive MRSA colonization surveillance program. It is not intended to diagnose MRSA infection nor to guide or monitor treatment for MRSA infections.      Radiology Studies: No results found.   Scheduled Meds: . feeding supplement (NEPRO CARB STEADY)  237 mL Oral TID WC  . lipase/protease/amylase  24,000 Units Oral TID AC & HS  . pantoprazole  40 mg Oral BID  . sodium chloride flush  10-40 mL Intracatheter Q12H   Continuous Infusions:   LOS: 9 days    Velvet Bathe, MD, FACP, [facility]. [facility] Pager [phone]  If 7PM-7AM, please contact night-coverage [email or web address] Password [email or web address] 01/06/2017, 6:20 PM

## 2017-01-06 NOTE — Progress Notes (Signed)
   1 AKI oliguric HD to be done today 2 GIB w/ slow ooze  3 Metastatic pnacreatic Ca 4 Anemia PRBCs on HD today 5 Swollen left leg--at risk for DVT, doppler  Subjective: Interval History: Oliguric  Objective: Vital signs in last 24 hours: Temp:  [98 F (36.7 C)-100.3 F (37.9 C)] 98.4 F (36.9 C) (04/13 0554) Pulse Rate:  [101-111] 101 (04/13 0554) Resp:  [16] 16 (04/13 0554) BP: (80-92)/(48-54) 90/48 (04/13 0554) SpO2:  [99 %-100 %] 99 % (04/13 0554) Weight:  [86.8 kg (191 lb 6.4 oz)] 86.8 kg (191 lb 6.4 oz) (04/13 0642) Weight change: -0.881 kg (-1 lb 15.1 oz)  Intake/Output from previous day: 04/12 0701 - 04/13 0700 In: 983.3 [P.O.:480; I.V.:438.3; IV Piggyback:25] Out: 100 [Urine:100] Intake/Output this shift: Total I/O In: 505 [P.O.:480; IV Piggyback:25] Out: -   General appearance: alert and cooperative Resp: clear to auscultation bilaterally Chest wall: no tenderness Cardio: regular rate and rhythm, S1, S2 normal, no murmur, click, rub or gallop [id number]+ on R, 1+ on [place] 2+ on R, 1+ on L  Abd: RUQ firm with masses  Lab Results:  Recent Labs  01/04/17 0433 01/05/17 1044  WBC 10.6* 11.1*  HGB 7.0* 7.3*  HCT 20.7* 21.6*  PLT 128* 129*   BMET:  Recent Labs  01/04/17 0433 01/05/17 1044  NA 131* 134*  K 5.2* 4.9  CL 92* 96*  CO2 25 26  GLUCOSE 65 77  BUN 42* 37*  CREATININE 6.33* 6.25*  CALCIUM 7.9* 7.7*   No results for input(s): PTH in the last 72 hours. Iron Studies: No results for input(s): IRON, TIBC, TRANSFERRIN, FERRITIN in the last 72 hours. Studies/Results: No results found.  Scheduled: . feeding supplement (NEPRO CARB STEADY)  237 mL Oral TID WC  . lipase/protease/amylase  24,000 Units Oral TID AC & HS  . pantoprazole  40 mg Oral BID  . sodium chloride flush  10-40 mL Intracatheter Q12H    LOS: 9 days   Vicie Cech C 01/06/2017,12:58 PM

## 2017-01-06 NOTE — Progress Notes (Deleted)
PROGRESS NOTE   Henry Barton  XBL:[id number]    DOB: 1961-06-07    DOA: 12/27/2016  PCP: Pcp Not In System   I have briefly reviewed patients previous medical records in [facility].  Brief Narrative:  56 year old male with a PMH of pancreatic cancer with metastases to liver, currently undergoing chemotherapy under the care of Dr. Berneice Gandy at [facility] (diagnosed 11/10/16 by liver biopsy, started palliative chemotherapy 11/29/16), HTN, remote history of PUD, anemia requiring outpatient transfusions, presented to ED for/3/18 with 3 days history of lack of energy, blood in stools and coffee-ground emesis. He was admitted by CCM to ICU for upper GI bleed, hemorrhagic shock and acute renal failure requiring CRRT. After stabilization, care was transferred to telemetry and TRH on 4/10. Nephrology consulting.   Assessment & Plan:   Active Problems:   GI bleed   Acute renal failure (ARF) (HCC)   Pancreatic cancer metastasized to liver [facility])   Palliative care by specialist   DNR (do not resuscitate) discussion   DNR (do not resuscitate)   AKI (acute kidney injury) ([place])   Severe protein-calorie malnutrition ([place])   Malignant neoplasm of pancreas ([place])   1. Acute upper GI bleed: Likely related to metastatic pancreatic cancer and mass erosion into GI tract +/- PUD. [place] [facility] was consulted and recommended no EGD due to increased multifactorial risk but continue PPI twice daily. Overall poor prognosis. 2. Acute blood loss anemia: Secondary to GI bleed. Has had multiple blood transfusions thus far. Hemoglobin down to 7. Discussed with nephrology and will transfuse a unit across dialysis. Transfuse to keep hemoglobin greater than 7 g per DL.We'll continue to monitor 3. Thrombocytopenia: Stable. 4. Acute kidney injury: Nephrologist on board and assisting. Hospital stay currently complicated by worsening renal function  5. Metastatic pancreatic cancer/ jaundice: Outpatient follow-up with oncologist at [facility]  upon discharge. As per palliative care follow-up with patient, they wish full scope of treatment, hopeful to resume chemotherapy once he has improved from acute illness. 6. Severe malnutrition in the context of chronic illness: Per dietitian. 7. Leukocytosis: Suspect stress de-margination. Resolved. 8. Hypoglycemia: Monitor CBGs 9. Depression: 10. Chronic pain   DVT prophylaxis: SCDs Code Status: DO NOT RESUSCITATE Family Communication: Discussed with spouse at bedside Disposition: DC once cleared for discharge by nephrology   Consultants:  [facility]-signed off [place] off Nephrology   Procedures:  HD PRBC transfusions  Antimicrobials:  None   Subjective: No new complaints reported today.  ROS: Negative.  Objective:  Vitals:   01/06/17 1645 01/06/17 1700 01/06/17 1724 01/06/17 1815  BP: (!) 112/58 (!) 100/59 111/69 (!) 91/47  Pulse: (!) 112 (!) 113 (!) 114 (!) 119  Resp:   14 18  Temp:   99.6 F (37.6 C) (!) 101.2 F (38.4 C)  TempSrc:   Oral Axillary  SpO2:   96% 96%  Weight:   86.8 kg (191 lb 5.8 oz)   Height:        Examination:  General exam: Pleasant middle-aged male, chronically ill looking, lying comfortably propped up in bed. Scleral icterus + + Respiratory system: Reduced breath sounds in the bases but otherwise clear to auscultation. Respiratory effort normal. Right side neck HD catheter.Port-A-Cath +. Cardiovascular system: S1 & S2 heard, RRR. No JVD, murmurs, rubs, gallops or clicks. 2+ pedal edema.  Gastrointestinal system: Abdomen is distended, not tense, soft, nontender. Epigastric/RUQ masses felt. Normal bowel sounds heard. Possible ascites. Central nervous system: Alert and oriented. No focal neurological deficits.  Extremities: Symmetric 5 x 5 power. Skin: No rashes, lesions or ulcers Psychiatry: Judgement and insight appear normal. Mood & affect appropriate.   Data Reviewed: I have personally reviewed following labs and imaging  studies  CBC:  Recent Labs Lab 01/02/17 0500  01/02/17 0512 01/03/17 0417 01/04/17 0433 01/05/17 1044 01/06/17 1453  WBC 15.0*  --   --  19.2* 10.6* 11.1* 10.2  HGB 7.4*  < > 7.8* 7.3* 7.0* 7.3* 7.7*  HCT 21.8*  < > 23.0* 21.5* 20.7* 21.6* 22.8*  MCV 86.2  --   --  86.3 85.9 85.4 86.0  PLT 104*  --   --  116* 128* 129* 167  < > = values in this interval not displayed. Basic Metabolic Panel:  Recent Labs Lab 12/31/16 0346  01/01/17 0350  01/01/17 1600  01/02/17 0500  01/02/17 0512 01/03/17 0417 01/04/17 0433 01/05/17 1044 01/06/17 1453  NA 134*  < > 136  136  < > 137  < > 136  < > 137 134* 131* 134* 131*  K 4.5  < > 4.5  4.6  < > 4.4  < > 3.9  < > 4.0 4.9 5.2* 4.9 4.8  CL 89*  < > 93*  93*  < > 94*  --  95*  --   --  94* 92* 96* 93*  CO2 26  < > 30  30  --  29  --  26  --   --  27 25 26 23   GLUCOSE 133*  < > 111*  112*  < > 131*  --  164*  --   --  65 65 77 122*  BUN 32*  < > 20  20  < > 17  --  13  --   --  21* 42* 37* 53*  CREATININE 3.58*  < > 3.17*  3.14*  < > 3.01*  --  2.73*  --   --  4.63* 6.33* 6.25* 8.18*  CALCIUM 11.3*  < > 8.5*  8.5*  --  7.6*  --  7.2*  --   --  7.7* 7.9* 7.7* 8.0*  MG 1.8  --  1.9  --   --   --  1.9  --   --   --   --   --   --   PHOS 4.7*  < > 3.4  3.4  --  3.1  --  2.3*  --   --  3.9 5.1*  --  6.0*  < > = values in this interval not displayed. Liver Function Tests:  Recent Labs Lab 01/01/17 0350 01/01/17 1600 01/02/17 0500 01/05/17 1044 01/06/17 1453  AST  --   --  192* 126*  --   ALT  --   --  84* 59  --   ALKPHOS  --   --  868* 876*  --   BILITOT  --   --  15.3* 14.6*  --   PROT  --   --  4.7* 4.4*  --   ALBUMIN <1.0* <1.0* <1.0* <1.0* <1.0*   Coagulation Profile: No results for input(s): INR, PROTIME in the last 168 hours. Cardiac Enzymes: No results for input(s): CKTOTAL, CKMB, CKMBINDEX, TROPONINI in the last 168 hours. CBG:  Recent Labs Lab 01/05/17 1714 01/05/17 2149 01/06/17 0751 01/06/17 0811  01/06/17 1212  GLUCAP 132* 133* 59* 72 111*    Recent Results (from the past 240 hour(s))  MRSA PCR Screening  Status: None   Collection Time: 12/28/16  5:22 AM  Result Value Ref Range Status   MRSA by PCR NEGATIVE NEGATIVE Final    Comment:        The GeneXpert MRSA Assay (FDA approved for NASAL specimens only), is one component of a comprehensive MRSA colonization surveillance program. It is not intended to diagnose MRSA infection nor to guide or monitor treatment for MRSA infections.      Radiology Studies: No results found.   Scheduled Meds: . feeding supplement (NEPRO CARB STEADY)  237 mL Oral TID WC  . lipase/protease/amylase  24,000 Units Oral TID AC & HS  . pantoprazole  40 mg Oral BID  . sodium chloride flush  10-40 mL Intracatheter Q12H   Continuous Infusions:   LOS: 9 days    Velvet Bathe, MD, FACP, [facility]. [facility] Pager [phone]  If 7PM-7AM, please contact night-coverage [email or web address] Password [facility] 01/06/2017, 6:24 PM

## 2017-01-07 ENCOUNTER — Inpatient Hospital Stay (HOSPITAL_COMMUNITY): Payer: BLUE CROSS/BLUE SHIELD

## 2017-01-07 DIAGNOSIS — M7989 Other specified soft tissue disorders: Secondary | ICD-10-CM

## 2017-01-07 LAB — GLUCOSE, CAPILLARY
GLUCOSE-CAPILLARY: 79 mg/dL (ref 65–99)
GLUCOSE-CAPILLARY: 82 mg/dL (ref 65–99)
GLUCOSE-CAPILLARY: 116 mg/dL — AB (ref 65–99)
GLUCOSE-CAPILLARY: 82 mg/dL (ref 65–99)
Glucose-Capillary: 54 mg/dL — ABNORMAL LOW (ref 65–99)
Glucose-Capillary: 101 mg/dL — ABNORMAL HIGH (ref 65–99)

## 2017-01-07 MED ORDER — UNJURY CHICKEN SOUP POWDER
2.0000 [oz_av] | Freq: Four times a day (QID) | ORAL | Status: DC
  Administered 2017-01-07: 2 [oz_av] via ORAL
  Filled 2017-01-07 (×2): qty 27

## 2017-01-07 MED ORDER — HEPARIN SOD (PORK) LOCK FLUSH 100 UNIT/ML IV SOLN
500.0000 [IU] | INTRAVENOUS | Status: DC | PRN
  Filled 2017-01-07: qty 5

## 2017-01-07 MED ORDER — UNJURY CHICKEN SOUP POWDER
1.0000 | Freq: Four times a day (QID) | ORAL | Status: DC
  Administered 2017-01-07 – 2017-01-10 (×9): 1 via ORAL
  Filled 2017-01-07 (×19): qty 27

## 2017-01-07 MED ORDER — HEPARIN SOD (PORK) LOCK FLUSH 100 UNIT/ML IV SOLN
500.0000 [IU] | INTRAVENOUS | Status: DC
  Filled 2017-01-07: qty 5

## 2017-01-07 NOTE — Progress Notes (Signed)
PROGRESS NOTE   Henry Barton  PYP:[id number]    DOB: October 30, 1960    DOA: 12/27/2016  PCP: Pcp Not In System   I have briefly reviewed patients previous medical records in [facility]  Brief Narrative:  56 year old male with a PMH of pancreatic cancer with metastases to liver, currently undergoing chemotherapy under the care of Dr. Berneice Gandy at [facility] (diagnosed 11/10/16 by liver biopsy, started palliative chemotherapy 11/29/16), HTN, remote history of PUD, anemia requiring outpatient transfusions, presented to ED for/3/18 with 3 days history of lack of energy, blood in stools and coffee-ground emesis. He was admitted by CCM to ICU for upper GI bleed, hemorrhagic shock and acute renal failure requiring CRRT. After stabilization, care was transferred to telemetry and TRH on 4/10. Nephrology consulting.   Assessment & Plan:   Active Problems:   GI bleed   Acute renal failure (ARF) (HCC)   Pancreatic cancer metastasized to liver [facility])   Palliative care by specialist   DNR (do not resuscitate) discussion   DNR (do not resuscitate)   AKI (acute kidney injury) ([place])   Severe protein-calorie malnutrition ([place])   Malignant neoplasm of pancreas ([place])   1. Acute upper GI bleed: Likely related to metastatic pancreatic cancer and mass erosion into GI tract +/- PUD. [facility] was consulted and recommended no EGD due to increased multifactorial risk but continue PPI twice daily. Overall poor prognosis. 2. Acute blood loss anemia: Secondary to GI bleed. Has had multiple blood transfusions thus far. Hemoglobin down to 7. Discussed with nephrology and will transfuse a unit across dialysis. Transfuse to keep hemoglobin greater than 7 g per DL.We'll continue to monitor 3. Thrombocytopenia: Stable. 4. Acute kidney injury: Nephrologist on board and assisting. Hospital stay currently complicated by worsening renal function  5. Metastatic pancreatic cancer/ jaundice: Outpatient follow-up with oncologist at [facility]  upon discharge. As per palliative care follow-up with patient, they wish full scope of treatment, hopeful to resume chemotherapy once he has improved from acute illness. 6. Severe malnutrition in the context of chronic illness: Will add protein supplement. Low albumin most likely due to poor oral intake. 7. Leukocytosis: Suspect stress de-margination. Resolved. 8. Hypoglycemia: Monitor CBGs 9. Depression: 10. Chronic pain   DVT prophylaxis: SCDs Code Status: DO NOT RESUSCITATE Family Communication: Discussed with daughter Disposition: DC once cleared for discharge by nephrology  Consultants:  [facility] GI-signed off [place] off Nephrology   Procedures:  HD PRBC transfusions  Antimicrobials:  None   Subjective: Discussed case with patient and daughter  ROS: Negative.  Objective:  Vitals:   01/06/17 1909 01/06/17 2113 01/07/17 0606 01/07/17 1426  BP:  (!) 88/50 (!) 108/58 (!) 93/56  Pulse:  (!) 106 (!) 113 (!) 102  Resp:  17 16 20   Temp: 97.8 F (36.6 C) 99.4 F (37.4 C) 99.8 F (37.7 C) 98.8 F (37.1 C)  TempSrc: Axillary  Oral Oral  SpO2:  95% 100% 99%  Weight:   87.3 kg (192 lb 8 oz)   Height:        Examination:  General exam: Pleasant middle-aged male, chronically ill looking, lying comfortably propped up in bed. Scleral icterus + + Respiratory system: Reduced breath sounds in the bases but otherwise clear to auscultation. Respiratory effort normal. Right side neck HD catheter.Port-A-Cath +. Cardiovascular system: S1 & S2 heard, RRR. No JVD, murmurs, rubs, gallops or clicks. 2+ pedal edema.  Gastrointestinal system: Abdomen is distended, not tense, soft, nontender. Epigastric/RUQ masses felt. Normal bowel sounds  heard. Possible ascites. Central nervous system: Alert and oriented. No focal neurological deficits. Extremities: Symmetric 5 x 5 power. Skin: No rashes, lesions or ulcers Psychiatry: Judgement and insight appear normal. Mood & affect appropriate.    Data Reviewed: I have personally reviewed following labs and imaging studies  CBC:  Recent Labs Lab 01/02/17 0500  01/02/17 0512 01/03/17 0417 01/04/17 0433 01/05/17 1044 01/06/17 1453  WBC 15.0*  --   --  19.2* 10.6* 11.1* 10.2  HGB 7.4*  < > 7.8* 7.3* 7.0* 7.3* 7.7*  HCT 21.8*  < > 23.0* 21.5* 20.7* 21.6* 22.8*  MCV 86.2  --   --  86.3 85.9 85.4 86.0  PLT 104*  --   --  116* 128* 129* 167  < > = values in this interval not displayed. Basic Metabolic Panel:  Recent Labs Lab 01/01/17 0350  01/01/17 1600  01/02/17 0500  01/02/17 0512 01/03/17 0417 01/04/17 0433 01/05/17 1044 01/06/17 1453  NA 136  136  < > 137  < > 136  < > 137 134* 131* 134* 131*  K 4.5  4.6  < > 4.4  < > 3.9  < > 4.0 4.9 5.2* 4.9 4.8  CL 93*  93*  < > 94*  --  95*  --   --  94* 92* 96* 93*  CO2 30  30  --  29  --  26  --   --  27 25 26 23   GLUCOSE 111*  112*  < > 131*  --  164*  --   --  65 65 77 122*  BUN 20  20  < > 17  --  13  --   --  21* 42* 37* 53*  CREATININE 3.17*  3.14*  < > 3.01*  --  2.73*  --   --  4.63* 6.33* 6.25* 8.18*  CALCIUM 8.5*  8.5*  --  7.6*  --  7.2*  --   --  7.7* 7.9* 7.7* 8.0*  MG 1.9  --   --   --  1.9  --   --   --   --   --   --   PHOS 3.4  3.4  --  3.1  --  2.3*  --   --  3.9 5.1*  --  6.0*  < > = values in this interval not displayed. Liver Function Tests:  Recent Labs Lab 01/01/17 0350 01/01/17 1600 01/02/17 0500 01/05/17 1044 01/06/17 1453  AST  --   --  192* 126*  --   ALT  --   --  84* 59  --   ALKPHOS  --   --  868* 876*  --   BILITOT  --   --  15.3* 14.6*  --   PROT  --   --  4.7* 4.4*  --   ALBUMIN <1.0* <1.0* <1.0* <1.0* <1.0*   Coagulation Profile: No results for input(s): INR, PROTIME in the last 168 hours. Cardiac Enzymes: No results for input(s): CKTOTAL, CKMB, CKMBINDEX, TROPONINI in the last 168 hours. CBG:  Recent Labs Lab 01/06/17 1809 01/06/17 2130 01/07/17 0817 01/07/17 0850 01/07/17 1146  GLUCAP 79 113* 54* 82 116*     No results found for this or any previous visit (from the past 240 hour(s)).   Radiology Studies: No results found.   Scheduled Meds: . feeding supplement (NEPRO CARB STEADY)  237 mL Oral TID WC  . lipase/protease/amylase  24,000 Units Oral  TID AC & HS  . pantoprazole  40 mg Oral BID  . protein supplement  1 packet Oral QID  . sodium chloride flush  10-40 mL Intracatheter Q12H   Continuous Infusions:   LOS: 10 days    Velvet Bathe, MD, FACP, [facility]. [facility] Pager [phone]  If 7PM-7AM, please contact night-coverage [email or web address] Password [facility] 01/07/2017, 3:17 PM

## 2017-01-07 NOTE — Progress Notes (Signed)
VASCULAR LAB PRELIMINARY  PRELIMINARY  PRELIMINARY  PRELIMINARY  Bilateral lower extremity venous duplex completed.    Preliminary report:  There is no DVT or SVT noted in the bilateral lower extremities.  There is interstitial fluid noted throughout the bilateral lower extremities.   Tyke Outman, RVT 01/07/2017, 10:12 AM

## 2017-01-07 NOTE — Progress Notes (Signed)
1 AKI oliguric HD to be done Monday 2GIB w/ slow ooze  3 Metastatic pnacreatic Ca 4 Anemia PRBCs on HD today 5 Swollen left leg--at risk for DVT, doppler negative for DVT  Rec: They are receptive to a Palliative Care conference and need one.  Dialysis will prolong life by preventing death from AKI, but not cancer.  Subjective: Interval History: HD yesterday  Objective: Vital signs in last 24 hours: Temp:  [97.8 F (36.6 C)-102.3 F (39.1 C)] 99.8 F (37.7 C) (04/14 0606) Pulse Rate:  [105-120] 113 (04/14 0606) Resp:  [14-18] 16 (04/14 0606) BP: (88-120)/(47-71) 108/58 (04/14 0606) SpO2:  [95 %-100 %] 100 % (04/14 0606) Weight:  [86.8 kg (191 lb 5.8 oz)-87.4 kg (192 lb 10.9 oz)] 87.3 kg (192 lb 8 oz) (04/14 0606) Weight change: 0.581 kg (1 lb 4.5 oz)  Intake/Output from previous day: 04/13 0701 - 04/14 0700 In: 1984 [P.O.:1934; IV Piggyback:50] Out: 681 [Urine:175] Intake/Output this shift: Total I/O In: 607 [P.O.:597; I.V.:10] Out: -   General appearance: alert and cooperative Extremities: edema 2+ on R, 1+ on L  Lab Results:  Recent Labs  01/05/17 1044 01/06/17 1453  WBC 11.1* 10.2  HGB 7.3* 7.7*  HCT 21.6* 22.8*  PLT 129* 167   BMET:  Recent Labs  01/05/17 1044 01/06/17 1453  NA 134* 131*  K 4.9 4.8  CL 96* 93*  CO2 26 23  GLUCOSE 77 122*  BUN 37* 53*  CREATININE 6.25* 8.18*  CALCIUM 7.7* 8.0*   No results for input(s): PTH in the last 72 hours. Iron Studies: No results for input(s): IRON, TIBC, TRANSFERRIN, FERRITIN in the last 72 hours. Studies/Results: No results found.  Scheduled: . feeding supplement (NEPRO CARB STEADY)  237 mL Oral TID WC  . lipase/protease/amylase  24,000 Units Oral TID AC & HS  . pantoprazole  40 mg Oral BID  . protein supplement  1 packet Oral QID  . sodium chloride flush  10-40 mL Intracatheter Q12H    LOS: 10 days   Nahiem Dredge C 01/07/2017,1:49 PM

## 2017-01-08 LAB — CBC
HCT: 21.7 % — ABNORMAL LOW (ref 39.0–52.0)
HEMOGLOBIN: 7.6 g/dL — AB (ref 13.0–17.0)
MCH: 29.9 pg (ref 26.0–34.0)
MCHC: 35 g/dL (ref 30.0–36.0)
MCV: 85.4 fL (ref 78.0–100.0)
Platelets: 189 10*3/uL (ref 150–400)
RBC: 2.54 MIL/uL — AB (ref 4.22–5.81)
RDW: 17.1 % — ABNORMAL HIGH (ref 11.5–15.5)
WBC: 10.7 10*3/uL — AB (ref 4.0–10.5)

## 2017-01-08 LAB — GLUCOSE, CAPILLARY
GLUCOSE-CAPILLARY: 64 mg/dL — AB (ref 65–99)
GLUCOSE-CAPILLARY: 86 mg/dL (ref 65–99)
GLUCOSE-CAPILLARY: 107 mg/dL — AB (ref 65–99)
GLUCOSE-CAPILLARY: 91 mg/dL (ref 65–99)
Glucose-Capillary: 76 mg/dL (ref 65–99)

## 2017-01-08 LAB — BASIC METABOLIC PANEL
ANION GAP: 14 (ref 5–15)
BUN: 58 mg/dL — ABNORMAL HIGH (ref 6–20)
CALCIUM: 8 mg/dL — AB (ref 8.9–10.3)
CO2: 22 mmol/L (ref 22–32)
Chloride: 95 mmol/L — ABNORMAL LOW (ref 101–111)
Creatinine, Ser: 8.43 mg/dL — ABNORMAL HIGH (ref 0.61–1.24)
GFR, EST AFRICAN AMERICAN: 7 mL/min — AB (ref >60)
GFR, EST NON AFRICAN AMERICAN: 6 mL/min — AB (ref >60)
Glucose, Bld: 104 mg/dL — ABNORMAL HIGH (ref 65–99)
Potassium: 4.3 mmol/L (ref 3.5–5.1)
SODIUM: 131 mmol/L — AB (ref 135–145)

## 2017-01-08 MED ORDER — PANCRELIPASE (LIP-PROT-AMYL) 36000-114000 UNITS PO CPEP
74000.0000 [IU] | ORAL_CAPSULE | Freq: Three times a day (TID) | ORAL | Status: DC
  Filled 2017-01-08: qty 2

## 2017-01-08 MED ORDER — PANCRELIPASE (LIP-PROT-AMYL) 12000-38000 UNITS PO CPEP
72000.0000 [IU] | ORAL_CAPSULE | Freq: Three times a day (TID) | ORAL | Status: DC
  Administered 2017-01-08 – 2017-01-15 (×17): 72000 [IU] via ORAL
  Filled 2017-01-08 (×18): qty 6

## 2017-01-08 MED ORDER — PANCRELIPASE (LIP-PROT-AMYL) 12000-38000 UNITS PO CPEP
24000.0000 [IU] | ORAL_CAPSULE | ORAL | Status: DC | PRN
  Administered 2017-01-08 – 2017-01-13 (×5): 24000 [IU] via ORAL
  Filled 2017-01-08 (×6): qty 2

## 2017-01-08 MED ORDER — SODIUM CHLORIDE 0.9 % IV SOLN: Freq: Once | INTRAVENOUS | Status: DC

## 2017-01-08 NOTE — Progress Notes (Signed)
PROGRESS NOTE   Henry Barton  FUX:[id number]    DOB: August 04, 1961    DOA: 12/27/2016  PCP: Pcp Not In System   I have briefly reviewed patients previous medical records in [facility].  Brief Narrative:  56 year old male with a PMH of pancreatic cancer with metastases to liver, currently undergoing chemotherapy under the care of Dr. Berneice Gandy at [facility] (diagnosed 11/10/16 by liver biopsy, started palliative chemotherapy 11/29/16), HTN, remote history of PUD, anemia requiring outpatient transfusions, presented to ED for/3/18 with 3 days history of lack of energy, blood in stools and coffee-ground emesis. He was admitted by CCM to ICU for upper GI bleed, hemorrhagic shock and acute renal failure requiring CRRT. After stabilization, care was transferred to telemetry and TRH on 4/10. Nephrology consulting.  With worsening in renal function the idea of palliative care was introduced of which patient and family is ok with for goals of care.  Assessment & Plan:   Active Problems:   GI bleed   Acute renal failure (ARF) (HCC)   Pancreatic cancer metastasized to liver [facility])   Palliative care by specialist   DNR (do not resuscitate) discussion   DNR (do not resuscitate)   AKI (acute kidney injury) ([place])   Severe protein-calorie malnutrition ([place])   Malignant neoplasm of pancreas ([place])   1. Acute upper GI bleed: Likely related to metastatic pancreatic cancer and mass erosion into GI tract +/- PUD. [place] [facility] was consulted and recommended no EGD due to increased multifactorial risk but continue PPI twice daily. Overall poor prognosis. 2. Acute blood loss anemia: Secondary to GI bleed. Has had multiple blood transfusions thus far. Hemoglobin down to 7. Discussed with nephrology and will transfuse a unit across dialysis. Transfuse to keep hemoglobin greater than 7 g per DL.We'll continue to monitor 3. Thrombocytopenia: Stable. 4. Acute kidney injury: Nephrologist on board and assisting. Hospital stay  currently complicated by worsening renal function  5. Metastatic pancreatic cancer/ jaundice: Outpatient follow-up with oncologist at [facility] upon discharge. As per palliative care follow-up with patient, they wish full scope of treatment, hopeful to resume chemotherapy once he has improved from acute illness. 6. Severe malnutrition in the context of chronic illness: Added protein supplement. Low albumin most likely due to poor oral intake. 7. Leukocytosis: Suspect stress de-margination. Resolved. 8. Hypoglycemia: Monitor CBGs 9. Depression: 10. Chronic pain   DVT prophylaxis: SCDs Code Status: DO NOT RESUSCITATE Family Communication: Discussed with daughter Disposition: Palliative on board to assist with planning given worsening renal function  Consultants:   GI-signed off CCM-signed off Nephrology   Procedures:  HD PRBC transfusions  Antimicrobials:  None   Subjective: Pt had problems with gas overnight. Otherwise no new complaints.  ROS: Negative.  Objective:  Vitals:   01/07/17 2132 01/07/17 2228 01/08/17 0003 01/08/17 0558  BP: (!) 103/48   (!) 94/55  Pulse: (!) 110   98  Resp: 18   18  Temp: (!) 101.8 F (38.8 C) (!) 101 F (38.3 C) 99.1 F (37.3 C) 98.2 F (36.8 C)  TempSrc: Oral Oral Oral Oral  SpO2: 99%   99%  Weight:    89.2 kg (196 lb 9.6 oz)  Height:        Examination:  General exam: Pleasant middle-aged male, chronically ill looking, sitting up in bed. Scleral icterus  Respiratory system: Reduced breath sounds in the bases but otherwise clear to auscultation. Respiratory effort normal. Right side neck HD catheter.Port-A-Cath +. Cardiovascular system: S1 & S2 heard,  RRR. No JVD, murmurs, rubs, gallops or clicks. 2+ pedal edema.  Gastrointestinal system: Abdomen is distended, not tense, soft, nontender. Epigastric/RUQ masses felt. Normal bowel sounds heard. Possible ascites. Central nervous system: Alert and oriented. No focal neurological  deficits. Extremities: Symmetric 5 x 5 power. Skin: No rashes, lesions or ulcers Psychiatry: Judgement and insight appear normal. Mood & affect appropriate.   Data Reviewed: I have personally reviewed following labs and imaging studies  CBC:  Recent Labs Lab 01/03/17 0417 01/04/17 0433 01/05/17 1044 01/06/17 1453 01/08/17 1041  WBC 19.2* 10.6* 11.1* 10.2 10.7*  HGB 7.3* 7.0* 7.3* 7.7* 7.6*  HCT 21.5* 20.7* 21.6* 22.8* 21.7*  MCV 86.3 85.9 85.4 86.0 85.4  PLT 116* 128* 129* 167 774   Basic Metabolic Panel:  Recent Labs Lab 01/01/17 1600  01/02/17 0500  01/03/17 0417 01/04/17 0433 01/05/17 1044 01/06/17 1453 01/08/17 1041  NA 137  < > 136  < > 134* 131* 134* 131* 131*  K 4.4  < > 3.9  < > 4.9 5.2* 4.9 4.8 4.3  CL 94*  --  95*  --  94* 92* 96* 93* 95*  CO2 29  --  26  --  27 25 26 23 22   GLUCOSE 131*  --  164*  --  65 65 77 122* 104*  BUN 17  --  13  --  21* 42* 37* 53* 58*  CREATININE 3.01*  --  2.73*  --  4.63* 6.33* 6.25* 8.18* 8.43*  CALCIUM 7.6*  --  7.2*  --  7.7* 7.9* 7.7* 8.0* 8.0*  MG  --   --  1.9  --   --   --   --   --   --   PHOS 3.1  --  2.3*  --  3.9 5.1*  --  6.0*  --   < > = values in this interval not displayed. Liver Function Tests:  Recent Labs Lab 01/01/17 1600 01/02/17 0500 01/05/17 1044 01/06/17 1453  AST  --  192* 126*  --   ALT  --  84* 59  --   ALKPHOS  --  868* 876*  --   BILITOT  --  15.3* 14.6*  --   PROT  --  4.7* 4.4*  --   ALBUMIN <1.0* <1.0* <1.0* <1.0*   Coagulation Profile: No results for input(s): INR, PROTIME in the last 168 hours. Cardiac Enzymes: No results for input(s): CKTOTAL, CKMB, CKMBINDEX, TROPONINI in the last 168 hours. CBG:  Recent Labs Lab 01/07/17 1701 01/07/17 2129 01/08/17 0838 01/08/17 0923 01/08/17 1207  GLUCAP 101* 82 64* 76 86    No results found for this or any previous visit (from the past 240 hour(s)).   Radiology Studies: No results found.   Scheduled Meds: . sodium chloride    Intravenous Once  . feeding supplement (NEPRO CARB STEADY)  237 mL Oral TID WC  . heparin lock flush  500 Units Intracatheter Q30 days  . lipase/protease/amylase  72,000 Units Oral TID AC  . pantoprazole  40 mg Oral BID  . protein supplement  1 packet Oral QID  . sodium chloride flush  10-40 mL Intracatheter Q12H   Continuous Infusions:   LOS: 11 days    Velvet Bathe, MD, FACP, [facility]. [facility] Pager [phone]  If 7PM-7AM, please contact night-coverage [email or web address] Password [facility] 01/08/2017, 3:42 PM

## 2017-01-08 NOTE — Progress Notes (Signed)
Patient and family requesting to have CREON PRN and dosage adjusted to 2 - 3 capsules.  Patient states that depending on what he eats; he will take two or three capsules. Wilson Singer, RN

## 2017-01-08 NOTE — Progress Notes (Addendum)
Pharmacy note: creon  56 yo male with pancreatic insufficieny (Metastatic pancreatic cancer) on creon PTA. Pharmacy consulted to help with Creon dosing.  I spoke with family and he typically takes 3-4 capsules (24,000 units of lipase each) with meals and 2 capsules with snacks.  Plan -Creon 72,000 units tidwc and 48,000 units with snacks -Please give Creon prior to meals (will add comments to administration in EPIC)  Will sign off. Please contact pharmacy with any other needs.  Thank you, Hildred Laser, Pharm D 01/08/2017 3:34 PM

## 2017-01-08 NOTE — Progress Notes (Signed)
CRITICAL VALUE ALERT  Critical value received:  CBG 64  Date of notification:  01/08/17  Time of notification:  0840  Critical value read back:Yes.    Nurse who received alert:  Rolland Porter  MD notified (1st page):    Time of first page:    MD notified (2nd page):  Time of second page:  Responding MD:    Time MD responded:   Orange juice given and CBG rechecked. CBG 76

## 2017-01-08 NOTE — Progress Notes (Signed)
1 AKI oliguric HD to be done Monday 2GIB w/ slow ooze  3 Metastatic pnacreatic Ca 4 Anemia PRBCs prn  Rec: He wants to be aggressive with his care.  He understands chemo Rx options limited or on hold.  Dialysis as outpatient would prevent death from renal failure, but not delay death from pancreatic cancer.          He would like to have a consultation with an Oncologist and discuss his cancer treatment options. I think this is reasonable and if no cancer treatment options available, then discuss OP HD(including need for [facility]).  Subjective: Interval History: Remains oliguric  Objective: Vital signs in last 24 hours: Temp:  [98.2 F (36.8 C)-101.8 F (38.8 C)] 98.2 F (36.8 C) (04/15 0558) Pulse Rate:  [98-110] 98 (04/15 0558) Resp:  [18] 18 (04/15 0558) BP: (94-103)/(48-55) 94/55 (04/15 0558) SpO2:  [99 %] 99 % (04/15 0558) Weight:  [89.2 kg (196 lb 9.6 oz)] 89.2 kg (196 lb 9.6 oz) (04/15 0558) Weight change: 1.777 kg (3 lb 14.7 oz)  Intake/Output from previous day: 04/14 0701 - 04/15 0700 In: 2067 [P.O.:2037; I.V.:30] Out: 100 [Urine:100] Intake/Output this shift: Total I/O In: 750 [P.O.:720; I.V.:30] Out: 100 [Urine:100]  General appearance: alert and cooperative Abd distended with firm masses RUQ Ext 2+ RLE, 1+ LLE Scleral icterus IJ Cath right neck  Extremities: edema 2+ on right, 1+ on left  Lab Results:  Recent Labs  01/06/17 1453 01/08/17 1041  WBC 10.2 10.7*  HGB 7.7* 7.6*  HCT 22.8* 21.7*  PLT 167 189   BMET:  Recent Labs  01/06/17 1453 01/08/17 1041  NA 131* 131*  K 4.8 4.3  CL 93* 95*  CO2 23 22  GLUCOSE 122* 104*  BUN 53* 58*  CREATININE 8.18* 8.43*  CALCIUM 8.0* 8.0*   No results for input(s): PTH in the last 72 hours. Iron Studies: No results for input(s): IRON, TIBC, TRANSFERRIN, FERRITIN in the last 72 hours. Studies/Results: No results found.  Scheduled: . feeding supplement (NEPRO CARB STEADY)  237 mL Oral TID WC  .  heparin lock flush  500 Units Intracatheter Q30 days  . lipase/protease/amylase  24,000 Units Oral TID AC & HS  . pantoprazole  40 mg Oral BID  . protein supplement  1 packet Oral QID  . sodium chloride flush  10-40 mL Intracatheter Q12H    LOS: 11 days   Myah Guynes C 01/08/2017,2:31 PM

## 2017-01-09 DIAGNOSIS — Z992 Dependence on renal dialysis: Secondary | ICD-10-CM

## 2017-01-09 DIAGNOSIS — N189 Chronic kidney disease, unspecified: Secondary | ICD-10-CM

## 2017-01-09 DIAGNOSIS — K7210 Chronic hepatic failure without coma: Secondary | ICD-10-CM

## 2017-01-09 LAB — GLUCOSE, CAPILLARY
GLUCOSE-CAPILLARY: 59 mg/dL — AB (ref 65–99)
GLUCOSE-CAPILLARY: 73 mg/dL (ref 65–99)
Glucose-Capillary: 83 mg/dL (ref 65–99)

## 2017-01-09 LAB — CBC
HCT: 19.2 % — ABNORMAL LOW (ref 39.0–52.0)
Hemoglobin: 6.5 g/dL — CL (ref 13.0–17.0)
MCH: 28.4 pg (ref 26.0–34.0)
MCHC: 33.9 g/dL (ref 30.0–36.0)
MCV: 83.8 fL (ref 78.0–100.0)
PLATELETS: 186 10*3/uL (ref 150–400)
RBC: 2.29 MIL/uL — ABNORMAL LOW (ref 4.22–5.81)
RDW: 16.9 % — AB (ref 11.5–15.5)
WBC: 10.4 10*3/uL (ref 4.0–10.5)

## 2017-01-09 LAB — BASIC METABOLIC PANEL
Anion gap: 15 (ref 5–15)
BUN: 64 mg/dL — ABNORMAL HIGH (ref 6–20)
CALCIUM: 7.6 mg/dL — AB (ref 8.9–10.3)
CO2: 20 mmol/L — AB (ref 22–32)
CREATININE: 9.39 mg/dL — AB (ref 0.61–1.24)
Chloride: 95 mmol/L — ABNORMAL LOW (ref 101–111)
GFR calc non Af Amer: 6 mL/min — ABNORMAL LOW (ref >60)
GFR, EST AFRICAN AMERICAN: 6 mL/min — AB (ref >60)
GLUCOSE: 68 mg/dL (ref 65–99)
Potassium: 4.6 mmol/L (ref 3.5–5.1)
Sodium: 130 mmol/L — ABNORMAL LOW (ref 135–145)

## 2017-01-09 LAB — PREPARE RBC (CROSSMATCH)

## 2017-01-09 MED ORDER — ALTEPLASE 2 MG IJ SOLR: 2.0000 mg | Freq: Once | INTRAMUSCULAR | Status: DC | PRN

## 2017-01-09 MED ORDER — LIDOCAINE HCL (PF) 1 % IJ SOLN
5.0000 mL | INTRAMUSCULAR | Status: DC | PRN
  Filled 2017-01-09: qty 5

## 2017-01-09 MED ORDER — LIDOCAINE-PRILOCAINE 2.5-2.5 % EX CREA
1.0000 "application " | TOPICAL_CREAM | CUTANEOUS | Status: DC | PRN
  Filled 2017-01-09: qty 5

## 2017-01-09 MED ORDER — SODIUM CHLORIDE 0.9 % IV SOLN: 100.0000 mL | INTRAVENOUS | Status: DC | PRN

## 2017-01-09 MED ORDER — HEPARIN SODIUM (PORCINE) 1000 UNIT/ML DIALYSIS
20.0000 [IU]/kg | INTRAMUSCULAR | Status: DC | PRN
  Filled 2017-01-09: qty 2

## 2017-01-09 MED ORDER — CHLORPROMAZINE HCL 25 MG PO TABS
25.0000 mg | ORAL_TABLET | Freq: Three times a day (TID) | ORAL | Status: DC | PRN
  Administered 2017-01-09 – 2017-01-14 (×9): 25 mg via ORAL
  Filled 2017-01-09 (×15): qty 1

## 2017-01-09 MED ORDER — HEPARIN SODIUM (PORCINE) 1000 UNIT/ML DIALYSIS
1000.0000 [IU] | INTRAMUSCULAR | Status: DC | PRN
  Filled 2017-01-09: qty 1

## 2017-01-09 MED ORDER — SODIUM CHLORIDE 0.9 % IV SOLN: Freq: Once | INTRAVENOUS | Status: DC

## 2017-01-09 MED ORDER — PENTAFLUOROPROP-TETRAFLUOROETH EX AERO: 1.0000 "application " | INHALATION_SPRAY | CUTANEOUS | Status: DC | PRN

## 2017-01-09 NOTE — Progress Notes (Signed)
  [facility] Progress Note   Assessment/ Plan:   1 AKI oliguric-- on HD, will continue to support with dialysis until [place] meeting held 2GIB w/ slow ooze-- Hgb to 6.5 today, getting transfused.  No ESA due to metastatic pancreatic Ca  3 Metastatic pancreatic Ca- was previously on Xelox, Dr. Berneice Gandy at [facility] pt's oncologist.  Pt requesting onc c/s here to see if any chemo options available to him now. 4 Anemia PRBCs prn   Subjective:     Nothing acute overnight.  Feeling relatively well.   Objective:   BP 115/77   Pulse 97   Temp 98.2 F (36.8 C) (Oral)   Resp 20   Ht 5\' 10"  (1.778 m)   Wt 92.1 kg (203 lb 0.7 oz)   SpO2 97%   BMI 29.13 kg/m   Physical Exam: Gen:NAD HEENT: + jaundice CVS:RRR Resp:unlabored Abd: + ascites Ext:3+ LE edema ACCESS: R IJ nontunneled HD cath  Labs: BMET  Recent Labs Lab 01/03/17 0417 01/04/17 0433 01/05/17 1044 01/06/17 1453 01/08/17 1041 01/09/17 0118  NA 134* 131* 134* 131* 131* 130*  K 4.9 5.2* 4.9 4.8 4.3 4.6  CL 94* 92* 96* 93* 95* 95*  CO2 27 25 26 23 22  20*  GLUCOSE 65 65 77 122* 104* 68  BUN 21* 42* 37* 53* 58* 64*  CREATININE 4.63* 6.33* 6.25* 8.18* 8.43* 9.39*  CALCIUM 7.7* 7.9* 7.7* 8.0* 8.0* 7.6*  PHOS 3.9 5.1*  --  6.0*  --   --    CBC  Recent Labs Lab 01/05/17 1044 01/06/17 1453 01/08/17 1041 01/09/17 0118  WBC 11.1* 10.2 10.7* 10.4  HGB 7.3* 7.7* 7.6* 6.5*  HCT 21.6* 22.8* 21.7* 19.2*  MCV 85.4 86.0 85.4 83.8  PLT 129* 167 189 186    @IMGRELPRIORS @ Medications:    . sodium chloride   Intravenous Once  . sodium chloride   Intravenous Once  . feeding supplement (NEPRO CARB STEADY)  237 mL Oral TID WC  . heparin lock flush  500 Units Intracatheter Q30 days  . lipase/protease/amylase  72,000 Units Oral TID AC  . pantoprazole  40 mg Oral BID  . protein supplement  1 packet Oral QID  . sodium chloride flush  10-40 mL Intracatheter Q12H     Madelon Lips MD 01/09/2017, 11:49 AM

## 2017-01-09 NOTE — Progress Notes (Signed)
CRITICAL VALUE ALERT  Critical value received:  Hemoglobin 6.5   Date of notification:  01/09/17  Time of notification:  2:24am  Critical value read back:Yes.    Nurse who received alert:  Kenna Gilbert  MD notified (1st page):  Schorr, NP  Time of first page:  2:36am

## 2017-01-09 NOTE — Progress Notes (Signed)
PROGRESS NOTE   Henry Barton  RXY:[id number]    DOB: 08/20/1961    DOA: 12/27/2016  PCP: Pcp Not In System   I have briefly reviewed patients previous medical records in [facility].  Brief Narrative:  56 year old male with a PMH of pancreatic cancer with metastases to liver, currently undergoing chemotherapy under the care of Dr. Berneice Gandy at [facility] (diagnosed 11/10/16 by liver biopsy, started palliative chemotherapy 11/29/16), HTN, remote history of PUD, anemia requiring outpatient transfusions, presented to ED for/3/18 with 3 days history of lack of energy, blood in stools and coffee-ground emesis. He was admitted by CCM to ICU for upper GI bleed, hemorrhagic shock and acute renal failure requiring CRRT. After stabilization, care was transferred to telemetry and TRH on 4/10. Nephrology consulting.  With worsening in renal function the idea of palliative care was introduced of which patient and family is ok with for goals of care.  Assessment & Plan:   Active Problems:   GI bleed   Acute renal failure (ARF) (HCC)   Pancreatic cancer metastasized to liver [facility])   Palliative care by specialist   DNR (do not resuscitate) discussion   DNR (do not resuscitate)   AKI (acute kidney injury) ([place])   Severe protein-calorie malnutrition ([place])   Malignant neoplasm of pancreas ([place])   1. Acute upper GI bleed: Likely related to metastatic pancreatic cancer and mass erosion into GI tract +/- PUD. [facility] was consulted and recommended no EGD due to increased multifactorial risk but continue PPI twice daily. Overall poor prognosis. 2. Acute blood loss anemia: Secondary to GI bleed. Has had multiple blood transfusions thus far. Hemoglobin down to 7. Discussed with nephrology and will transfuse a unit across dialysis. Transfuse to keep hemoglobin greater than 7 g per DL.We'll continue to monitor 3. Thrombocytopenia: Stable. 4. Acute kidney injury: Nephrologist on board and assisting. Hospital stay  currently complicated by worsening renal function. Patient undergoing dialysis. Plan is to meet with palliative for discussion on GOC. 5. Metastatic pancreatic cancer/ jaundice: Outpatient follow-up with oncologist at [facility] upon discharge. As per palliative care follow-up with patient, they wish full scope of treatment, hopeful to resume chemotherapy once he has improved from acute illness. 6. Severe malnutrition in the context of chronic illness: Added protein supplement. Low albumin most likely due to poor oral intake. 7. Leukocytosis: Suspect stress de-margination. Resolved. 8. Hypoglycemia: Monitor CBGs 9. Depression: 10. Chronic pain   DVT prophylaxis: SCDs Code Status: DO NOT RESUSCITATE Family Communication: Discussed with daughter Disposition: Palliative on board to assist with planning given worsening renal function  Consultants:  [facility]-signed off CCM-signed off Nephrology   Procedures:  HD PRBC transfusions  Antimicrobials:  None   Subjective: Pt has no new complaints. No acute issues overnight.  ROS: Negative.  Objective:  Vitals:   01/09/17 1200 01/09/17 1230 01/09/17 1300 01/09/17 1309  BP: 107/63 107/67 108/66 115/72  Pulse: 99 (!) 101 (!) 103 (!) 102  Resp: 16 18 16 17   Temp:    98.5 F (36.9 C)  TempSrc:    Oral  SpO2:    96%  Weight:    90 kg (198 lb 6.6 oz)  Height:        Examination:  General exam: Pleasant middle-aged male, chronically ill looking, sitting up in bed. Scleral icterus  Respiratory system: Reduced breath sounds in the bases but otherwise clear to auscultation. Respiratory effort normal. Right side neck HD catheter.Port-A-Cath +. Cardiovascular system: S1 & S2 heard,  RRR. No JVD, murmurs, rubs, gallops or clicks. 2+ pedal edema.  Gastrointestinal system: Abdomen is distended, not tense, soft, nontender. Epigastric/RUQ masses felt. Normal bowel sounds heard. Possible ascites. Central nervous system: Alert and oriented. No focal  neurological deficits. Extremities: Symmetric 5 x 5 power. Skin: No rashes, lesions or ulcers Psychiatry: Judgement and insight appear normal. Mood & affect appropriate.   Data Reviewed: I have personally reviewed following labs and imaging studies  CBC:  Recent Labs Lab 01/04/17 0433 01/05/17 1044 01/06/17 1453 01/08/17 1041 01/09/17 0118  WBC 10.6* 11.1* 10.2 10.7* 10.4  HGB 7.0* 7.3* 7.7* 7.6* 6.5*  HCT 20.7* 21.6* 22.8* 21.7* 19.2*  MCV 85.9 85.4 86.0 85.4 83.8  PLT 128* 129* 167 189 093   Basic Metabolic Panel:  Recent Labs Lab 01/03/17 0417 01/04/17 0433 01/05/17 1044 01/06/17 1453 01/08/17 1041 01/09/17 0118  NA 134* 131* 134* 131* 131* 130*  K 4.9 5.2* 4.9 4.8 4.3 4.6  CL 94* 92* 96* 93* 95* 95*  CO2 27 25 26 23 22  20*  GLUCOSE 65 65 77 122* 104* 68  BUN 21* 42* 37* 53* 58* 64*  CREATININE 4.63* 6.33* 6.25* 8.18* 8.43* 9.39*  CALCIUM 7.7* 7.9* 7.7* 8.0* 8.0* 7.6*  PHOS 3.9 5.1*  --  6.0*  --   --    Liver Function Tests:  Recent Labs Lab 01/05/17 1044 01/06/17 1453  AST 126*  --   ALT 59  --   ALKPHOS 876*  --   BILITOT 14.6*  --   PROT 4.4*  --   ALBUMIN <1.0* <1.0*   Coagulation Profile: No results for input(s): INR, PROTIME in the last 168 hours. Cardiac Enzymes: No results for input(s): CKTOTAL, CKMB, CKMBINDEX, TROPONINI in the last 168 hours. CBG:  Recent Labs Lab 01/08/17 0923 01/08/17 1207 01/08/17 1717 01/08/17 2141 01/09/17 0820  GLUCAP 76 86 107* 91 59*    No results found for this or any previous visit (from the past 240 hour(s)).   Radiology Studies: No results found.   Scheduled Meds: . sodium chloride   Intravenous Once  . sodium chloride   Intravenous Once  . feeding supplement (NEPRO CARB STEADY)  237 mL Oral TID WC  . heparin lock flush  500 Units Intracatheter Q30 days  . lipase/protease/amylase  72,000 Units Oral TID AC  . pantoprazole  40 mg Oral BID  . protein supplement  1 packet Oral QID  . sodium  chloride flush  10-40 mL Intracatheter Q12H   Continuous Infusions:   LOS: 12 days    Velvet Bathe, MD, FACP, [facility]. [facility] Pager [phone]  If 7PM-7AM, please contact night-coverage [email or web address] Password [email or web address] 01/09/2017, 3:11 PM

## 2017-01-09 NOTE — Procedures (Signed)
Patient seen and examined on Hemodialysis. QB400 UF goal 2.5L Treatment adjusted as needed.  Madelon Lips MD 11:56 AM

## 2017-01-09 NOTE — Consult Note (Signed)
Marland Kitchen    HEMATOLOGY/ONCOLOGY CONSULTATION NOTE  Date of Service: 01/09/2017  Patient Care Team: Pcp Not In System as PCP - [place], PA-C as Physician Assistant (Gastroenterology)  CHIEF COMPLAINTS/PURPOSE OF CONSULTATION:  Metastatic Pancreatic Cancer with extensive liver metastases.  HISTORY OF PRESENTING ILLNESS:   Henry Barton is a wonderful 56 y.o. male who has been referred to [place] by Dr Velvet Bathe, MD for evaluation and management of metastatic pancreatic cancer and help with goals of care discussion.  Patient has been recently diagnosed with metastatic pancreatic cancer with extensive metastases to the liver diagnosed on 11/10/2016 by biopsy of one of the liver metastases. He is being managed by Dr Berneice Gandy at [facility]. He was apparently started on palliative chemotherapy with Xelox. Did not have to many treatment options due to extensive liver involvement with significant elevated liver function tests and bilirubin more than 5 and also some element of chronic kidney disease.  Patient also has a remote history of peptic ulcer disease, hypertension We'll presented to the hospital on 12/27/2016 with weakness hypotension as well as dark bloody stools and coffee-ground hematemesis .  He was noted to have significant hyponatremia 126, severe hyperkalemia of 7.5 acute on chronic renal failure with a creatinine of 10.69 significantly abnormal liver function tests with a bilirubin level of 16.9 alkaline phosphatase of 1285 ALT of 131 AST of 302 . Hemoglobin of 4.3 with fecal occult blood testing positive .  He was having abdominal pain and therefore was taking naproxen at home and there was concern that he might have developed peptic ulcer disease and was on IV protonix. He was initially in hemorrhagic shock and under the care of critical care medicine. He has since been started on continuous renal replacement therapy .  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 He continues to have significant renal failure and abnormal liver function tests . Severe malnutrition with very low albumin levels .  Patient has a fair understanding of the severity of his disease. He understands that with his current kidney and liver function, poor performance status, active GI bleeding and poor nutritional status he would not be a good candidate for any additional palliative chemotherapy . Also his organ dysfunction precludes most types of treatment for his metastatic pancreatic cancer . He notes he understands this and wonders about the logistics of where his management might go and if he is going to be in the hospital or discharged home .  We discussed that it depends on his goals of care and after understanding the grave prognosis what his and his wife's choices might be . Marland Kitchen Understands that his prognosis is grave and that he could decompensate rather quickly in days to weeks . No significant uncontrolled pain at this time .   continues to do significant amount of weight . Wife is at bedside and is quite supportive .  MEDICAL HISTORY:  Past Medical History:  Diagnosis Date  . Cancer [facility])    pancreatic, liver  . Hypertension   . Peptic ulcer disease    Nonbleeding & remote    SURGICAL HISTORY: Past Surgical History:  Procedure Laterality Date  . HERNIA REPAIR     age 51  . PERCUTANEOUS LIVER BIOPSY  10/2016  . VASECTOMY      SOCIAL HISTORY: Social History   Social History  . Marital  status: Married    Spouse name: N/A  . Number of children: N/A  . Years of education: N/A   Occupational History  . Not on file.   Social History Main Topics  . Smoking status: Never Smoker  . Smokeless tobacco: Never Used  . Alcohol use No  . Drug use: Unknown  . Sexual activity: Not on file   Other Topics Concern  . Not on file   Social  History Narrative   [facility] (12/27/16):   Lives with his wife and has been married for 32 years. This signifies wife is his primary decision maker with his daughter as a Naval architect. Patient has no living will or legal healthcare power of attorney signified.    FAMILY HISTORY: Family History  Problem Relation Age of Onset  . Lung cancer Maternal Grandmother     ALLERGIES:  has No Known Allergies.  MEDICATIONS:  Current Facility-Administered Medications  Medication Dose Route Frequency Provider Last Rate Last Dose  . 0.9 %  sodium chloride infusion  250 mL Intravenous PRN Estanislado Emms, MD 100 mL/hr at 01/03/17 1324 1,000 mL at 01/03/17 1324  . 0.9 %  sodium chloride infusion   Intravenous Once Estanislado Emms, MD      . 0.9 %  sodium chloride infusion   Intravenous Once Jeryl Columbia, NP      . acetaminophen (TYLENOL) tablet 650 mg  650 mg Oral TID PRN Rigoberto Noel, MD   650 mg at 01/07/17 2239  . chlorproMAZINE (THORAZINE) tablet 25 mg  25 mg Oral TID PRN Velvet Bathe, MD      . feeding supplement (NEPRO CARB STEADY) liquid 237 mL  237 mL Oral TID WC Modena Jansky, MD   237 mL at 01/08/17 1835  . heparin lock flush 100 unit/mL  500 Units Intracatheter Q30 days Velvet Bathe, MD       And  . heparin lock flush 100 unit/mL  500 Units Intracatheter PRN Velvet Bathe, MD      . lipase/protease/amylase (CREON) capsule 24,000 Units  24,000 Units Oral PRN Kris Mouton, RPH   24,000 Units at 01/09/17 0044  . lipase/protease/amylase (CREON) capsule 72,000 Units  72,000 Units Oral TID [facility] Velvet Bathe, MD   72,000 Units at 01/09/17 1406  . ondansetron (ZOFRAN) injection 4 mg  4 mg Intravenous Q6H PRN Javier Glazier, MD   4 mg at 01/09/17 1409  . oxyCODONE-acetaminophen (PERCOCET/ROXICET) 5-325 MG per tablet 1-2 tablet  1-2 tablet Oral Q4H PRN Norman Herrlich, MD   1 tablet at 01/09/17 0044  . pantoprazole (PROTONIX) EC tablet 40 mg  40 mg Oral BID Theone Murdoch  Hammons, RPH   40 mg at 01/08/17 2137  . protein supplement (UNJURY CHICKEN SOUP) powder 1 packet  1 packet Oral QID Velvet Bathe, MD   1 packet at 01/08/17 2137  . simethicone (MYLICON) chewable tablet 80 mg  80 mg Oral Q6H PRN Anders Simmonds, MD   80 mg at 01/09/17 1409  . sodium chloride flush (NS) 0.9 % injection 10-40 mL  10-40 mL Intracatheter Q12H Javier Glazier, MD   10 mL at 01/07/17 0854  . sodium chloride flush (NS) 0.9 % injection 10-40 mL  10-40 mL Intracatheter PRN Javier Glazier, MD   10 mL at 01/08/17 1433    REVIEW OF SYSTEMS:    10 Point review of Systems was done is negative except as noted above.  PHYSICAL EXAMINATION: ECOG PERFORMANCE STATUS: 3-4  . Vitals:   01/09/17 1309 01/09/17 1547  BP: 115/72 101/64  Pulse: (!) 102 (!) 109  Resp: 17 16  Temp: 98.5 F (36.9 C) (!) 100.7 F (38.2 C)   Filed Weights   01/08/17 0558 01/09/17 0920 01/09/17 1309  Weight: 196 lb 9.6 oz (89.2 kg) 203 lb 0.7 oz (92.1 kg) 198 lb 6.6 oz (90 kg)   .Body mass index is 28.47 kg/m.  GENERAL:alert, in no acute distress and comfortable SKIN: no acute rashes, no significant lesions EYES: conjunctiva and oral mucosa pale, sclera icteric OROPHARYNX: MMM, no exudates, no oropharyngeal erythema or ulceration NECK: supple, no JVD LYMPH:  no palpable lymphadenopathy in the cervical, axillary or inguinal regions LUNGS: clear to auscultation b/l with normal respiratory effort HEART: regular rate & rhythm ABDOMEN: Significant hepatomegaly with palpable right upper quadrant hard liver mass Extremity: Bilateral 3+ pedal edema PSYCH: alert & oriented x 3 with fluent speech NEURO: no focal motor/sensory deficits  LABORATORY DATA:  I have reviewed the data as listed  . CBC Latest Ref Rng & Units 01/09/2017 01/08/2017 01/06/2017  WBC 4.0 - 10.5 K/uL 10.4 10.7(H) 10.2  Hemoglobin 13.0 - 17.0 g/dL 6.5(LL) 7.6(L) 7.7(L)  Hematocrit 39.0 - 52.0 % 19.2(L) 21.7(L) 22.8(L)  Platelets 150 -  400 K/uL 186 189 167    . CMP Latest Ref Rng & Units 01/09/2017 01/08/2017 01/06/2017  Glucose 65 - 99 mg/dL 68 104(H) 122(H)  BUN 6 - 20 mg/dL 64(H) 58(H) 53(H)  Creatinine 0.61 - 1.24 mg/dL 9.39(H) 8.43(H) 8.18(H)  Sodium 135 - 145 mmol/L 130(L) 131(L) 131(L)  Potassium 3.5 - 5.1 mmol/L 4.6 4.3 4.8  Chloride 101 - 111 mmol/L 95(L) 95(L) 93(L)  CO2 22 - 32 mmol/L 20(L) 22 23  Calcium 8.9 - 10.3 mg/dL 7.6(L) 8.0(L) 8.0(L)  Total Protein 6.5 - 8.1 g/dL - - -  Total Bilirubin 0.3 - 1.2 mg/dL - - -  Alkaline Phos 38 - 126 U/L - - -  AST 15 - 41 U/L - - -  ALT 17 - 63 U/L - - -     RADIOGRAPHIC STUDIES: I have personally reviewed the radiological images as listed and agreed with the findings in the report. Ct Abdomen Pelvis Wo Contrast  Result Date: 12/30/2016 CLINICAL DATA:  GI bleed.  Is pancreatic cancer. EXAM: CT ABDOMEN AND PELVIS WITHOUT CONTRAST TECHNIQUE: Multidetector CT imaging of the abdomen and pelvis was performed following the standard protocol without IV contrast. COMPARISON:  Abdomen CT 11/02/2016 FINDINGS: Lower chest: Subsegmental atelectasis noted right lower lobe. 5 mm perifissural nodule seen in the right lower lung on image 1 series 4. Hepatobiliary: Bulky liver lesions are again noted, poorly defined given the lack of intravenous contrast material. Some of the lesions probably measure up to 7-8 cm. Lesions appear confluent in some areas. Gallbladder not visualized. Insert no biliary Pancreas: Pancreatic head mass seen previously at 4.0 x 2.9 cm now measures 3.8 x 3.1 cm Spleen: No splenomegaly. No focal mass lesion. Adrenals/Urinary Tract: No adrenal nodule or mass. No hydronephrosis in either kidney. Bladder moderately distended. Stomach/Bowel: Stomach is nondistended. No evidence for small bowel obstruction. No colonic obstruction. Vascular/Lymphatic: No abdominal aortic aneurysm. No definite lymphadenopathy in the abdomen or pelvis. Reproductive: The prostate gland and  seminal vesicles have normal imaging features. Other: Small to moderate volume intraperitoneal free fluid is evident. Musculoskeletal: Diffuse body wall and mesenteric edema is noted. Bone windows reveal no worrisome lytic or  sclerotic osseous lesions. IMPRESSION: Limited study due to lack of intravenous contrast and cachexia. Bulky hepatic metastases with no substantial change in size of the pancreatic head mass. Small moderate volume ascites with diffuse body wall edema. Electronically Signed   By: Misty Stanley M.D.   On: 12/30/2016 15:33   US Renal Port  Result Date: 12/28/2016 CLINICAL DATA:  Acute renal failure EXAM: RENAL / URINARY TRACT ULTRASOUND COMPLETE COMPARISON:  CT from 11/02/2016, ultrasound from 10/21/2016 FINDINGS: Right Kidney: Length: 9.9 cm. Increased echogenicity. Mild caliectasis of the upper pole. No mass or hydronephrosis visualized. Left Kidney: Length: 11.8 cm. Increased echogenicity. No mass or hydronephrosis visualized. Bladder: Nondistended suboptimally assessed. Additional findings: Heterogeneous masslike abnormalities within the liver with adjacent ascites. Surface nodularity of the liver raises concern for cirrhosis. IMPRESSION: 1. Echogenic kidneys bilaterally consistent with medical renal disease. No obstructive uropathy. 2. Known masslike abnormalities of the liver with adjacent ascites. Electronically Signed   By: Ashley Royalty M.D.   On: 12/28/2016 02:39   Dg Chest Port 1 View  Result Date: 12/28/2016 CLINICAL DATA:  Central line placement portable chest x-ray of earlier today EXAM: PORTABLE CHEST 1 VIEW COMPARISON:  Portable chest x-ray of earlier today. FINDINGS: There has been interval placement of a large caliber right internal jugular venous catheter. The tip projects over the midportion of the SVC. There is no postprocedure pneumothorax or hemothorax. The Port-A-Cath appliance tip is again located over the distal SVC. The lungs are reasonably well inflated and clear.  The heart is top-normal in size. The pulmonary vascularity is normal. IMPRESSION: No postprocedure complication following right internal jugular venous catheter placement. Electronically Signed   By: David  Martinique M.D.   On: 12/28/2016 10:48   Dg Chest Port 1 View  Result Date: 12/28/2016 CLINICAL DATA:  Weakness and hypotension, onset around noon EXAM: PORTABLE CHEST 1 VIEW COMPARISON:  None. FINDINGS: Right-sided port extends to the expected location of the cavoatrial junction. Mild right hemidiaphragm elevation. No airspace consolidation. Normal pulmonary vasculature. No large effusion. Unremarkable hilar, mediastinal and cardiac contours. IMPRESSION: No acute cardiopulmonary findings. Electronically Signed   By: Andreas Newport M.D.   On: 12/28/2016 01:11   Dg Abd Portable 1v  Result Date: 12/28/2016 CLINICAL DATA:  Weakness and hypotension, onset around noon. Hematochezia. EXAM: PORTABLE ABDOMEN - 1 VIEW COMPARISON:  None. FINDINGS: Moderate gaseous distention of the stomach. Scattered air throughout unobstructed small and large bowel. No extraluminal gas is evident on this single AP supine image. IMPRESSION: Moderate gaseous distention of the stomach. Electronically Signed   By: Andreas Newport M.D.   On: 12/28/2016 01:11    ASSESSMENT & PLAN:   56 yo AAM with   1) Pancreatic cancer extensively metastatic to the liver . Treatment options even at diagnosis were limited significantly by his abnormal liver function tests with a bilirubin level of more than 15 now . He also has acute on chronic renal failure requiring dialysis currently which precludes other treatment options as well .  He has received minimal treatment with Xelox with Dr. Berneice Gandy at [facility].  Currently his nutritional status is extremely poor and he has an ECOG performance status of 3-4 . This along with his renal insufficiency liver failure and multiple other decompensating medical issues makes him a very poor candidate  consideration of any further palliative chemotherapy at this time . We discussed that it is unlikely that he would get to a point that he can tolerate palliative chemotherapy .  2)  acute upper GI bleeding - ongoing . He was previously on steroids and NSAIDs which could have caused peptic ulcer disease .cannot rule out the possibility of the pancreatic tumor eroding the duodenum . The fact that he has severe renal failure and possible uremic platelet dysfunction and possible coagulopathy related to liver failure probably complicates this picture further .  3) acute on chronic liver failure   4) worsening liver failure due to extensive metastatic involvement of the liver from metastatic pancreatic cancer.  Plan -I initially got a sense of his oral understanding of his current medical status . Patient is quite realistic about the fact that he is in a tough spot and there are no good treatment options available . -We discussed that he would not be a candidate for palliative chemotherapy at this time given his poor performance status, poor nutritional status and limiting liver and kidney dysfunction . -He is aware that there is no curative therapy available for his pancreatic cancer . -He is being followed by nephrology for hemodialysis as needed . -He continues to have GI bleeding and has been off NSAIDs and is getting transfused when necessary . -from an oncology perspective would strongly recommend moving towards hospice care with ongoing goals of care discussions and  Trying to back off on futile cares. -Patient and his wife are appreciated to the counseling and honesty opinion and are open to working with palliative care for further goals of care discussions and trying to workout the logistics of his care.  All of the patients and his wife's questions were answered to their apparent satisfaction. The patient knows to call the clinic with any problems, questions or concerns.  I spent 60 minutes  counseling the patient face to face. The total time spent in the appointment was 80 minutes and more than 50% was on counseling and direct patient cares and obtained and review oncology records from [place].   Sullivan Lone MD [place] AAHIVMS [facility] Hematology/Oncology Physician [facility]  (Office):       [phone] (Work cell):  [phone] (Fax):           [phone]  01/09/2017 5:21 PM

## 2017-01-09 NOTE — Progress Notes (Signed)
Patient off unit to Dialysis. Assessment completed; discontinued port a cath IV, called IV team to flush in dialysis.  Mother at bedside

## 2017-01-09 NOTE — Progress Notes (Signed)
Daily Progress Note   Patient Name: Henry Barton       Date: 01/09/2017 DOB: 22-May-1961  Age: 56 y.o. MRN#: [id number] Attending Physician: Velvet Bathe, MD Primary Care Physician: Pcp Not In System Admit Date: 12/27/2016  Reason for Consultation/Follow-up: Establishing GOC and emotional support  Subjective: - continued conversation regarding diagnosis,  Poor prognosis with limited treatment options.   - Patient and his wife verbalize request for a second opinion from an oncology standpoint.  We discussed that Dr Berneice Gandy from [facility] shared with him that he did not have any other viable treatment options at this time.    - Mr Jeffreys understands his limited prognosis but is not ready to shift to a full comfort path at this time.   He struggles for more time.   Will f/u in am  after oncology weighs in   Length of Stay: 12  Current Medications: Scheduled Meds:  . sodium chloride   Intravenous Once  . sodium chloride   Intravenous Once  . feeding supplement (NEPRO CARB STEADY)  237 mL Oral TID WC  . heparin lock flush  500 Units Intracatheter Q30 days  . lipase/protease/amylase  72,000 Units Oral TID AC  . pantoprazole  40 mg Oral BID  . protein supplement  1 packet Oral QID  . sodium chloride flush  10-40 mL Intracatheter Q12H    Continuous Infusions:   PRN Meds: sodium chloride, acetaminophen, chlorproMAZINE, heparin lock flush **AND** heparin lock flush, lipase/protease/amylase, ondansetron (ZOFRAN) IV, oxyCODONE-acetaminophen, simethicone, sodium chloride flush  Physical Exam  Constitutional: He appears cachectic. He appears ill.  HENT:  Mouth/Throat: Oropharynx is clear and moist.  Cardiovascular: Tachycardia present.   Pulmonary/Chest: He has decreased breath sounds in the right  lower field and the left lower field.  Abdominal: He exhibits distension.  Skin: Skin is warm and dry.  -sclera is yellowed            Vital Signs: BP 101/64 (BP Location: Left Arm)   Pulse (!) 109   Temp (!) 100.7 F (38.2 C)   Resp 16   Ht 5\' 10"  (1.778 m)   Wt 90 kg (198 lb 6.6 oz)   SpO2 99%   BMI 28.47 kg/m  SpO2: SpO2: 99 % O2 Device: O2 Device: Not Delivered O2 Flow Rate: O2 Flow  Rate (L/min): 4 L/min  Intake/output summary:  Intake/Output Summary (Last 24 hours) at 01/09/17 1930 Last data filed at 01/09/17 1309  Gross per 24 hour  Intake          1014.17 ml  Output             2000 ml  Net          -985.83 ml   LBM: Last BM Date: 01/07/17 Baseline Weight: Weight: 78.9 kg (174 lb) Most recent weight: Weight: 90 kg (198 lb 6.6 oz)       Palliative Assessment/Data: 30 % at best    Flowsheet Rows     Most Recent Value  Intake Tab  Referral Department  Critical care  Unit at Time of Referral  ICU  Palliative Care Primary Diagnosis  Cancer  Date Notified  12/28/16  Palliative Care Type  New Palliative care  Reason for referral  Clarify Goals of Care  Date of Admission  12/27/16  Date first seen by Palliative Care  12/28/16  # of days Palliative referral response time  0 Day(s)  # of days IP prior to Palliative referral  1  Clinical Assessment  Palliative Performance Scale Score  20%  Psychosocial & Spiritual Assessment  Palliative Care Outcomes      Patient Active Problem List   Diagnosis Date Noted  . Malignant neoplasm of pancreas ([place])   . AKI (acute kidney injury) ([place])   . Severe protein-calorie malnutrition ([place])   . DNR (do not resuscitate)   . GI bleed 12/28/2016  . Acute renal failure (ARF) ([place])   . Pancreatic cancer metastasized to liver ([place])   . Palliative care by specialist   . DNR (do not resuscitate) discussion     Palliative Care Assessment & Plan   Patient Profile: Pancreatic cancer extensively metastatic to the liver  . Treatment options even at diagnosis were limited significantly by his abnormal liver function tests with a bilirubin level of more than 15 currently. He also has acute on chronic renal failure requiring dialysis currently which precludes other treatment options as well .  Assessment: Continued physical and functional decline 2/2 to metastatic pancreatic cancer  Recommendations/Plan:  Patient is requesting to speak to an oncologist her at [place] the treatable, continue dialsysis   Code Status:    Code Status Orders        Start     Ordered   01/04/17 2316  Limited resuscitation (code)  Continuous    Question Answer Comment  In the event of cardiac or respiratory ARREST: Initiate Code Blue, Call Rapid Response Yes   In the event of cardiac or respiratory ARREST: Perform CPR Yes   In the event of cardiac or respiratory ARREST: Perform Intubation/Mechanical Ventilation No   In the event of cardiac or respiratory ARREST: Use NIPPV/BiPAp only if indicated Yes   In the event of cardiac or respiratory ARREST: Administer ACLS medications if indicated Yes   In the event of cardiac or respiratory ARREST: Perform Defibrillation or Cardioversion if indicated Yes      01/04/17 2315    Code Status History    Date Active Date Inactive Code Status Order ID Comments User Context   12/29/2016 11:54 AM 01/04/2017 11:15 PM DNR [id number]  Norman Herrlich, MD Inpatient   12/29/2016 10:41 AM 12/29/2016 11:54 AM Partial Code [id number]  Rush Farmer, MD Inpatient   12/29/2016 12:16 AM 12/29/2016 10:41 AM Full Code [id number]  Dellia Nims, MD Inpatient   12/28/2016  5:05 PM 12/29/2016 12:16 AM Partial Code [id number]  Knox Royalty, NP Inpatient   12/28/2016  3:29 PM 12/28/2016  5:05 PM DNR [id number]  Rush Farmer, MD Inpatient   12/28/2016 12:33 AM 12/28/2016  3:29 PM Partial Code [id number]  Javier Glazier, MD ED       Prognosis:  Less than 4 weeks  Discharge Planning:  To be determined  Care  plan was discussed with Dr Wendee Beavers  Thank you for allowing the Palliative Medicine Team to assist in the care of this patient.   Time In: 1500 Time Out: 1535 Total Time 35 min Prolonged Time Billed  no       Greater than 50%  of this time was spent counseling and coordinating care related to the above assessment and plan.  Wadie Lessen, NP  Please contact Palliative Medicine Team phone at [phone] for questions and concerns.

## 2017-01-09 NOTE — Progress Notes (Signed)
Pt's scheduled to have blood transfusion during hemodialysis in the morning. Blood type & screen orders for pt had expired. Pt needs a new type & screen order. NP on call was notified. Will continue to monitor.

## 2017-01-10 LAB — GLUCOSE, CAPILLARY
GLUCOSE-CAPILLARY: 138 mg/dL — AB (ref 65–99)
GLUCOSE-CAPILLARY: 74 mg/dL (ref 65–99)
Glucose-Capillary: 94 mg/dL (ref 65–99)
Glucose-Capillary: 81 mg/dL (ref 65–99)

## 2017-01-10 LAB — TYPE AND SCREEN
ABO/RH(D): A POS
Antibody Screen: NEGATIVE
Unit division: 0
Unit division: 0

## 2017-01-10 LAB — BPAM RBC
Blood Product Expiration Date: 201805032359
Blood Product Expiration Date: 201805032359
ISSUE DATE / TIME: 201804160356
ISSUE DATE / TIME: 201804161046
Unit Type and Rh: 6200
Unit Type and Rh: 6200

## 2017-01-10 LAB — BASIC METABOLIC PANEL
Anion gap: 15 (ref 5–15)
BUN: 43 mg/dL — ABNORMAL HIGH (ref 6–20)
CHLORIDE: 95 mmol/L — AB (ref 101–111)
CO2: 22 mmol/L (ref 22–32)
Calcium: 7.8 mg/dL — ABNORMAL LOW (ref 8.9–10.3)
Creatinine, Ser: 7.12 mg/dL — ABNORMAL HIGH (ref 0.61–1.24)
GFR, EST AFRICAN AMERICAN: 9 mL/min — AB (ref >60)
GFR, EST NON AFRICAN AMERICAN: 8 mL/min — AB (ref >60)
Glucose, Bld: 87 mg/dL (ref 65–99)
POTASSIUM: 4.4 mmol/L (ref 3.5–5.1)
SODIUM: 132 mmol/L — AB (ref 135–145)

## 2017-01-10 LAB — CBC
HEMATOCRIT: 24.4 % — AB (ref 39.0–52.0)
Hemoglobin: 8.5 g/dL — ABNORMAL LOW (ref 13.0–17.0)
MCH: 28.9 pg (ref 26.0–34.0)
MCHC: 34.4 g/dL (ref 30.0–36.0)
MCV: 83.8 fL (ref 78.0–100.0)
PLATELETS: 202 10*3/uL (ref 150–400)
RBC: 2.91 MIL/uL — AB (ref 4.22–5.81)
RDW: 16.4 % — AB (ref 11.5–15.5)
WBC: 8.2 10*3/uL (ref 4.0–10.5)

## 2017-01-10 NOTE — Progress Notes (Signed)
  [facility] Progress Note   Assessment/ Plan:   1 AKI oliguric-- on HD, will continue to support with dialysis until risks outweigh benefits 2GIB w/ slow ooze-- Hgb 6.5--> 8.0, getting transfused.  No ESA due to metastatic pancreatic Ca  3 Metastatic pancreatic Ca- was previously on Xelox, Dr. Berneice Gandy at [facility] pt's oncologist.  Onc consulted, greatly appreciate assistance 4 Anemia PRBCs prn 5.  Dispo: anticipating discharge to hospice with dialysis support until risks outweigh benefits.    Subjective:   Pt with wife and mom at bedside.  He has decided to continue dialysis until risks outweigh benefits.  I forsee blood pressure becoming an issue and that would be the limiting factor.  As such, he needs a TDC   Objective:   BP (!) 101/53 (BP Location: Left Arm)   Pulse (!) 110   Temp 99.7 F (37.6 C) (Oral)   Resp 20   Ht 5\' 10"  (1.778 m)   Wt 95.2 kg (209 lb 14.4 oz)   SpO2 100%   BMI 30.12 kg/m   Physical Exam: Gen:NAD HEENT: + jaundice CVS:RRR Resp:unlabored Abd: + ascites Ext:3+ LE edema ACCESS: R IJ nontunneled HD cath  Labs: BMET  Recent Labs Lab 01/04/17 0433 01/05/17 1044 01/06/17 1453 01/08/17 1041 01/09/17 0118 01/10/17 0438  NA 131* 134* 131* 131* 130* 132*  K 5.2* 4.9 4.8 4.3 4.6 4.4  CL 92* 96* 93* 95* 95* 95*  CO2 25 26 23 22  20* 22  GLUCOSE 65 77 122* 104* 68 87  BUN 42* 37* 53* 58* 64* 43*  CREATININE 6.33* 6.25* 8.18* 8.43* 9.39* 7.12*  CALCIUM 7.9* 7.7* 8.0* 8.0* 7.6* 7.8*  PHOS 5.1*  --  6.0*  --   --   --    CBC  Recent Labs Lab 01/06/17 1453 01/08/17 1041 01/09/17 0118 01/10/17 0438  WBC 10.2 10.7* 10.4 8.2  HGB 7.7* 7.6* 6.5* 8.5*  HCT 22.8* 21.7* 19.2* 24.4*  MCV 86.0 85.4 83.8 83.8  PLT 167 189 186 202    @IMGRELPRIORS @ Medications:    . feeding supplement (NEPRO CARB STEADY)  237 mL Oral TID WC  . heparin lock flush  500 Units Intracatheter Q30 days  . lipase/protease/amylase  72,000 Units Oral TID AC   . pantoprazole  40 mg Oral BID  . protein supplement  1 packet Oral QID  . sodium chloride flush  10-40 mL Intracatheter Q12H     Madelon Lips MD 01/10/2017, 4:17 PM

## 2017-01-10 NOTE — Progress Notes (Signed)
PROGRESS NOTE   Henry Barton  IHK:[id number]    DOB: Mar 14, 1961    DOA: 12/27/2016  PCP: Pcp Not In System   I have briefly reviewed patients previous medical records in [facility]  Brief Narrative:  56 year old male with a PMH of pancreatic cancer with metastases to liver, currently undergoing chemotherapy under the care of Dr. Berneice Gandy at [facility] (diagnosed 11/10/16 by liver biopsy, started palliative chemotherapy 11/29/16), HTN, remote history of PUD, anemia requiring outpatient transfusions, presented to ED for/3/18 with 3 days history of lack of energy, blood in stools and coffee-ground emesis. He was admitted by CCM to ICU for upper GI bleed, hemorrhagic shock and acute renal failure requiring CRRT. After stabilization, care was transferred to telemetry and TRH on 4/10. Nephrology consulting.  Patient currently has non-curative pancreatic cancer chest metastasized to his liver. Oncologist was called for second opinion who currently is recommending hospice. Palliative is on board. A shunt currently deciding when to have his last dialysis session.  Assessment & Plan:   Active Problems:   GI bleed   Acute renal failure (ARF) (H[facility])   Pancreatic cancer metastasized to liver [facility])   Palliative care by specialist   DNR (do not resuscitate) discussion   DNR (do not resuscitate)   AKI (acute kidney injury) ([place])   Severe protein-calorie malnutrition ([place])   Malignant neoplasm of pancreas ([place])   1. Acute upper GI bleed: Likely related to metastatic pancreatic cancer and mass erosion into GI tract +/- PUD. [facility] was consulted and recommended no EGD due to increased multifactorial risk but continue PPI twice daily. Overall poor prognosis. Transfuse when necessary until patient decides for comfort care measures 2. Acute blood loss anemia: Secondary to GI bleed. Has had multiple blood transfusions thus far. Hemoglobin down to 7. Discussed with nephrology. Transfuse until patient is ready to  transition to comfort care measures 3. Thrombocytopenia: Stable. 4. Acute kidney injury: Nephrologist on board and assisting. Hospital stay currently complicated by worsening renal function. Patient undergoing dialysis. Plan is to meet with palliative for discussion on GOC. 5. Metastatic pancreatic cancer/ jaundice: Reportedly was told by [facility] oncologist set there was nothing that oncologist could offer. Patient asked for second opinion and oncologist was subsequently consulted for second opinion. Unfortunately there are no treatment options and our oncologist is recommending hospice. 6. Severe malnutrition in the context of chronic illness: Added protein supplement. Low albumin most likely due to poor oral intake. 7. Leukocytosis: Suspect stress de-margination. Resolved. 8. Hypoglycemia: Monitor CBGs 9. Depression: 10. Chronic pain: treat supportively   DVT prophylaxis: SCDs Code Status: DO NOT RESUSCITATE Family Communication: Discussed with daughter Disposition: Palliative on board and assisting with disposition  Consultants:  [facility]-signed off [place] off Nephrology   Procedures:  HD PRBC transfusions  Antimicrobials:  None   Subjective: Pt has no new complaints. No acute issues overnight.  ROS: Negative.  Objective:  Vitals:   01/09/17 2319 01/10/17 0501 01/10/17 0502 01/10/17 1436  BP:  117/75  (!) 101/53  Pulse:  (!) 111  (!) 110  Resp:  18  20  Temp: (!) 101 F (38.3 C) (!) 100.4 F (38 C)  (!) 102.9 F (39.4 C)  TempSrc: Oral Oral  Oral  SpO2:  94%  100%  Weight:   95.2 kg (209 lb 14.4 oz)   Height:        Examination:  General exam: Pleasant middle-aged male, chronically ill looking, sitting up in bed. Scleral icterus  Respiratory system: Reduced breath sounds in the bases but otherwise clear to auscultation. Respiratory effort normal. Right side neck HD catheter.Port-A-Cath +. Cardiovascular system: S1 & S2 heard, RRR. No JVD, murmurs, rubs,  gallops or clicks. 2+ pedal edema.  Gastrointestinal system: Abdomen is distended, not tense, soft, nontender. Epigastric/RUQ masses felt. Normal bowel sounds heard. Possible ascites. Central nervous system: Alert and oriented. No focal neurological deficits. Extremities: Symmetric 5 x 5 power. Skin: No rashes, lesions or ulcers Psychiatry: Judgement and insight appear normal. Mood & affect appropriate.   Data Reviewed: I have personally reviewed following labs and imaging studies  CBC:  Recent Labs Lab 01/05/17 1044 01/06/17 1453 01/08/17 1041 01/09/17 0118 01/10/17 0438  WBC 11.1* 10.2 10.7* 10.4 8.2  HGB 7.3* 7.7* 7.6* 6.5* 8.5*  HCT 21.6* 22.8* 21.7* 19.2* 24.4*  MCV 85.4 86.0 85.4 83.8 83.8  PLT 129* 167 189 186 859   Basic Metabolic Panel:  Recent Labs Lab 01/04/17 0433 01/05/17 1044 01/06/17 1453 01/08/17 1041 01/09/17 0118 01/10/17 0438  NA 131* 134* 131* 131* 130* 132*  K 5.2* 4.9 4.8 4.3 4.6 4.4  CL 92* 96* 93* 95* 95* 95*  CO2 25 26 23 22  20* 22  GLUCOSE 65 77 122* 104* 68 87  BUN 42* 37* 53* 58* 64* 43*  CREATININE 6.33* 6.25* 8.18* 8.43* 9.39* 7.12*  CALCIUM 7.9* 7.7* 8.0* 8.0* 7.6* 7.8*  PHOS 5.1*  --  6.0*  --   --   --    Liver Function Tests:  Recent Labs Lab 01/05/17 1044 01/06/17 1453  AST 126*  --   ALT 59  --   ALKPHOS 876*  --   BILITOT 14.6*  --   PROT 4.4*  --   ALBUMIN <1.0* <1.0*   Coagulation Profile: No results for input(s): INR, PROTIME in the last 168 hours. Cardiac Enzymes: No results for input(s): CKTOTAL, CKMB, CKMBINDEX, TROPONINI in the last 168 hours. CBG:  Recent Labs Lab 01/09/17 0820 01/09/17 1735 01/09/17 2152 01/10/17 0808 01/10/17 1240  GLUCAP 59* 83 73 94 138*    No results found for this or any previous visit (from the past 240 hour(s)).   Radiology Studies: No results found.   Scheduled Meds: . feeding supplement (NEPRO CARB STEADY)  237 mL Oral TID WC  . heparin lock flush  500 Units  Intracatheter Q30 days  . lipase/protease/amylase  72,000 Units Oral TID AC  . pantoprazole  40 mg Oral BID  . protein supplement  1 packet Oral QID  . sodium chloride flush  10-40 mL Intracatheter Q12H   Continuous Infusions: . sodium chloride 1,000 mL (01/03/17 1324)  . sodium chloride    . sodium chloride       LOS: 13 days    Velvet Bathe, MD, FACP, [facility]. [facility] Pager [phone]  If 7PM-7AM, please contact night-coverage [email or web address] Password [facility] 01/10/2017, 3:56 PM

## 2017-01-10 NOTE — Progress Notes (Signed)
Patient ID: Henry Barton, male   DOB: [birth year], 56 y.o.   MRN: [id number]  This NP visited patient and his wife at the bedside as a follow up to  yesterday's conversation regarding diagnosis, prognosis, limited treatment options.  Today the patient is able to express his understanding that "there really isn't much more to do"   He appreciated Dr Grier Mitts visit yesterday afternoon.  Although he understands the reality and limited prognosis he continues to struggle with how he wants to move forward as he transitions at EOL.   The hardest decision for him is if/when to stop dialysis.  We discussed in detail his body's ability to even tolerate dialysis as his underlying cancer disease progresses.    We examined the patient's desires for himself at this time and how he wants to spend his time, knowing time is limited.   Family and faith are priority.  We discussed various options; home with hospice vs hospice facility.     Patient would like to attempt dialysis tomorrow, "if my BP can't take it then decision is made and I think I would want to go to the hospice facility"  Questions and concerns addressed  Discussed with Dr Wendee Beavers       I will f/u in the morning  Time in  0820         Time out    0900    Total time 40 min  Greater than 50% of the time was spent in counseling and coordination of care  Wadie Lessen NP  Palliative Medicine Team Team Phone # [phone] Pager [phone]

## 2017-01-11 LAB — CBC
HEMATOCRIT: 23 % — AB (ref 39.0–52.0)
HEMOGLOBIN: 7.8 g/dL — AB (ref 13.0–17.0)
MCH: 28.5 pg (ref 26.0–34.0)
MCHC: 33.9 g/dL (ref 30.0–36.0)
MCV: 83.9 fL (ref 78.0–100.0)
Platelets: 156 10*3/uL (ref 150–400)
RBC: 2.74 MIL/uL — ABNORMAL LOW (ref 4.22–5.81)
RDW: 16.3 % — ABNORMAL HIGH (ref 11.5–15.5)
WBC: 12.2 10*3/uL — ABNORMAL HIGH (ref 4.0–10.5)

## 2017-01-11 LAB — RENAL FUNCTION PANEL
ANION GAP: 15 (ref 5–15)
BUN: 53 mg/dL — ABNORMAL HIGH (ref 6–20)
CO2: 22 mmol/L (ref 22–32)
Calcium: 8.2 mg/dL — ABNORMAL LOW (ref 8.9–10.3)
Chloride: 93 mmol/L — ABNORMAL LOW (ref 101–111)
Creatinine, Ser: 9.41 mg/dL — ABNORMAL HIGH (ref 0.61–1.24)
GFR calc non Af Amer: 6 mL/min — ABNORMAL LOW (ref >60)
GFR, EST AFRICAN AMERICAN: 6 mL/min — AB (ref >60)
GLUCOSE: 74 mg/dL (ref 65–99)
PHOSPHORUS: 7.7 mg/dL — AB (ref 2.5–4.6)
POTASSIUM: 4.8 mmol/L (ref 3.5–5.1)
Sodium: 130 mmol/L — ABNORMAL LOW (ref 135–145)

## 2017-01-11 LAB — GLUCOSE, CAPILLARY
GLUCOSE-CAPILLARY: 80 mg/dL (ref 65–99)
Glucose-Capillary: 48 mg/dL — ABNORMAL LOW (ref 65–99)
Glucose-Capillary: 67 mg/dL (ref 65–99)
Glucose-Capillary: 64 mg/dL — ABNORMAL LOW (ref 65–99)

## 2017-01-11 LAB — PROTIME-INR
INR: 1.65
Prothrombin Time: 19.7 seconds — ABNORMAL HIGH (ref 11.4–15.2)

## 2017-01-11 MED ORDER — DEXTROSE 50 % IV SOLN
INTRAVENOUS | Status: AC
  Administered 2017-01-11 (×2): 25 mL
  Filled 2017-01-11: qty 50

## 2017-01-11 MED ORDER — PRO-STAT SUGAR FREE PO LIQD
30.0000 mL | Freq: Two times a day (BID) | ORAL | Status: DC
  Administered 2017-01-11 – 2017-01-15 (×7): 30 mL via ORAL
  Filled 2017-01-11 (×7): qty 30

## 2017-01-11 MED ORDER — BOOST / RESOURCE BREEZE PO LIQD
1.0000 | Freq: Three times a day (TID) | ORAL | Status: DC
  Administered 2017-01-11 – 2017-01-15 (×10): 1 via ORAL

## 2017-01-11 NOTE — Progress Notes (Signed)
CRITICAL VALUE ALERT  Critical value received:  CBG 48  Date of notification:  01/11/17  Time of notification:  9:10  Critical value read back:No.  Nurse who received alert:  Rosalio Loud RN  MD notified (1st page):  Dr. Broadus John (Face to Face)  Time of first page:  9:13  Pt NPO 25 ml of D50 given  9:52 CBG 67 25 ml D50 given  Pt left for Hemodialysis 10:15

## 2017-01-11 NOTE — Progress Notes (Signed)
  [facility] Progress Note   Assessment/ Plan:   1 AKI oliguric-- on HD, will continue to support with dialysis until risks outweigh benefits, tolerated HD today (on MWF sched) 2GIB w/ slow ooze-- Hgb 6.5--> 8.0, getting transfused.  No ESA due to metastatic pancreatic Ca  3 Metastatic pancreatic Ca- was previously on Xelox, Dr. Berneice Gandy at [facility] pt's oncologist.  Onc consulted, greatly appreciate assistance 4 Anemia PRBCs prn 5.  Dispo: anticipating discharge to hospice with dialysis support until risks outweigh benefits.    Subjective:   No acute events   Objective:   BP 102/63 (BP Location: Left Arm)   Pulse (!) 114   Temp (!) 100.5 F (38.1 C) (Oral)   Resp 20   Ht 5\' 10"  (1.778 m)   Wt 90.6 kg (199 lb 11.8 oz)   SpO2 98%   BMI 28.66 kg/m   Physical Exam: Gen:NAD HEENT: + jaundice CVS:RRR Resp:unlabored Abd: + ascites Ext:3+ LE edema ACCESS: R IJ nontunneled HD cath  Labs: BMET  Recent Labs Lab 01/05/17 1044 01/06/17 1453 01/08/17 1041 01/09/17 0118 01/10/17 0438 01/11/17 0956  NA 134* 131* 131* 130* 132* 130*  K 4.9 4.8 4.3 4.6 4.4 4.8  CL 96* 93* 95* 95* 95* 93*  CO2 26 23 22  20* 22 22  GLUCOSE 77 122* 104* 68 87 74  BUN 37* 53* 58* 64* 43* 53*  CREATININE 6.25* 8.18* 8.43* 9.39* 7.12* 9.41*  CALCIUM 7.7* 8.0* 8.0* 7.6* 7.8* 8.2*  PHOS  --  6.0*  --   --   --  7.7*   CBC  Recent Labs Lab 01/08/17 1041 01/09/17 0118 01/10/17 0438 01/11/17 0956  WBC 10.7* 10.4 8.2 12.2*  HGB 7.6* 6.5* 8.5* 7.8*  HCT 21.7* 19.2* 24.4* 23.0*  MCV 85.4 83.8 83.8 83.9  PLT 189 186 202 156    @IMGRELPRIORS @ Medications:    . feeding supplement  1 Container Oral TID WC  . feeding supplement (PRO-STAT SUGAR FREE 64)  30 mL Oral BID  . heparin lock flush  500 Units Intracatheter Q30 days  . lipase/protease/amylase  72,000 Units Oral TID AC  . pantoprazole  40 mg Oral BID  . sodium chloride flush  10-40 mL Intracatheter Q12H     Madelon Lips MD 01/11/2017, 4:39 PM

## 2017-01-11 NOTE — Progress Notes (Signed)
Nutrition Follow-up  DOCUMENTATION CODES:   Severe malnutrition in context of chronic illness  INTERVENTION:   -D/c Nepro Shake po TID, each supplement provides 425 kcal and 19 grams protein -D/c Unjury QID -30 ml Prostat BID, each supplement provides 100 kcals and 15 grams protein -Boost Breeze po TID, each supplement provides 250 kcal and 9 grams of protein  NUTRITION DIAGNOSIS:   Malnutrition related to chronic illness (pancreatic cancer with liver mets) as evidenced by severe depletion of muscle mass, severe depletion of body fat, energy intake < or equal to 75% for > or equal to 1 month, percent weight loss (11-19% weight loss within 6 months).  Progressing  GOAL:   Patient will meet greater than or equal to 90% of their needs  Progressing  MONITOR:   Diet advancement, PO intake, Weight trends, Labs, I & O's  REASON FOR ASSESSMENT:   Malnutrition Screening Tool    ASSESSMENT:   56 yo male with PMH of pancreatic CA with liver mets (undergoing chemotherapy at [facility]) who was admitted on 4/3 with GIB and hyperkalemia. Transferred to [facility] from [facility] for HD.  4/9- CRRT d/c 4/9- transfer from ICU to floor 4/11- plan to transition to iHD  Pt out of room at time of visit.   Palliative care team following for [place]; pt and family understand limited treatment options, however, pt having difficulty determining when to stop HD. Plan is for HD treatment today to assess tolerance with plan for potential TDC. Per PCT notes, pt plan to transition to residential hospice if unable to tolerate HD today.   Meal completion appears to have improved; PO: 25-100%. Pt accepting supplements. Pt currently has Nepro and Unjury ordered. Per RN, pt prefers Boost Breeze supplements.   Labs reviewed.   Diet Order:  Diet NPO time specified  Skin:  Reviewed, no issues  Last BM:  01/10/17  Height:   Ht Readings from Last 1 Encounters:  12/28/16 5\' 10"  (1.778 m)    Weight:   Wt Readings from  Last 1 Encounters:  01/11/17 212 lb (96.2 kg)    Ideal Body Weight:  75.5 kg  BMI:  Body mass index is 30.42 kg/m.  Estimated Nutritional Needs:   Kcal:  2400-2600  Protein:  120-135 grams  Fluid:  per MD  EDUCATION NEEDS:   No education needs identified at this time  Ariyah Sedlack A. Jimmye Norman, RD, LDN, CDE Pager: [phone] After hours Pager: [phone]

## 2017-01-11 NOTE — Progress Notes (Signed)
PROGRESS NOTE   Henry Barton  ACZ:[id number]    DOB: 06-Dec-1960    DOA: 12/27/2016  PCP: Pcp Not In System  Brief Narrative:  56 year old male with a PMH of pancreatic cancer with metastases to liver, currently undergoing chemotherapy under the care of Dr. Berneice Gandy at [facility] (diagnosed 11/10/16 by liver biopsy, started palliative chemotherapy 11/29/16), HTN, remote history of PUD, anemia requiring outpatient transfusions, presented to ED for/3/18 with 3 days history of lack of energy, blood in stools and coffee-ground emesis. He was admitted by CCM to ICU for upper GI bleed, hemorrhagic shock and acute renal failure requiring CRRT. After stabilization, care was transferred to telemetry and TRH on 4/10. Nephrology following. Patient currently has non-curative pancreatic cancer metastasized to his liver. Oncologist was called for second opinion who currently is recommending hospice. Palliative following and assisting with care  Assessment & Plan:   1. Acute upper GI bleed: Likely related to metastatic pancreatic cancer and mass erosion into GI tract +/- PUD. [facility] was consulted and recommended no EGD due to increased multifactorial risk -  continue PPI twice daily.  -  Overall poor prognosis.  -  Transfused 10 units PRBC  2. Acute blood loss anemia: Secondary to GI bleed.  -s/p 10 units PRBC so far -transfuse to keep Hb >7, last transfusion 4/16  3. Acute kidney injury with volume overload and third spacing - Initially was on CRRT but was transitioned to intermittent HD on 4/9  - Renal following - overall prognosis very poor  - continuing HD for now until BP tolerates, this is Futile care  4. Metastatic pancreatic cancer/ jaundice: - managed by Dr Berneice Gandy at [facility].  -he was started on palliative chemotherapy with Xelox. Did not have to many treatment options due to extensive liver involvement with significant elevated liver function tests and bilirubin more than 5 and also some element  of chronic kidney disease, he was told that nothing else to offer -s/p Second opinion per Dr.Kale on 4/16, he was again told abt grave prognosis of days to weeks and from an oncology perspective he strongly recommend moving towards hospice care  5. Severe malnutrition in the context of chronic illness:  -on protein supplements  6. Depression:  7. Chronic pain: treat supportively   DVT prophylaxis: SCDs Code Status: DO NOT RESUSCITATE Family Communication: wife at bedside Disposition: Palliative on board and assisting with disposition  Consultants:  [facility]-signed off [place] off Nephrology   Procedures:  HD PRBC transfusions  Antimicrobials:  None   Subjective: Had some cramps in R leg  Objective:  Vitals:   01/11/17 1145 01/11/17 1200 01/11/17 1230 01/11/17 1300  BP: (!) 90/55 93/60 103/63 (!) 100/58  Pulse: 95 96 94 98  Resp:      Temp:      TempSrc:      SpO2:      Weight:      Height:        Examination:  Gen:  chronically ill appearing male, laying in bed  Lungs: diminished BS at bases. Right side neck HD catheter.Port-A-Cath +. Cardiovascular system: S1 & S2 heard, RRR.  Abd: soft, distended, nontender. Epigastric/RUQ masses felt. Normal bowel sounds heard. Possible ascites. Central nervous system: Alert and oriented. No focal neurological deficits. Extremities: Symmetric 5 x 5 power, 3plus edema extending to R thigh Skin: No rashes, lesions or ulcers Psychiatry: Judgement and insight appear normal. Mood & affect appropriate.   Data Reviewed: I have personally reviewed  following labs and imaging studies  CBC:  Recent Labs Lab 01/06/17 1453 01/08/17 1041 01/09/17 0118 01/10/17 0438 01/11/17 0956  WBC 10.2 10.7* 10.4 8.2 12.2*  HGB 7.7* 7.6* 6.5* 8.5* 7.8*  HCT 22.8* 21.7* 19.2* 24.4* 23.0*  MCV 86.0 85.4 83.8 83.8 83.9  PLT 167 189 186 202 826   Basic Metabolic Panel:  Recent Labs Lab 01/06/17 1453 01/08/17 1041 01/09/17 0118  01/10/17 0438 01/11/17 0956  NA 131* 131* 130* 132* 130*  K 4.8 4.3 4.6 4.4 4.8  CL 93* 95* 95* 95* 93*  CO2 23 22 20* 22 22  GLUCOSE 122* 104* 68 87 74  BUN 53* 58* 64* 43* 53*  CREATININE 8.18* 8.43* 9.39* 7.12* 9.41*  CALCIUM 8.0* 8.0* 7.6* 7.8* 8.2*  PHOS 6.0*  --   --   --  7.7*   Liver Function Tests:  Recent Labs Lab 01/05/17 1044 01/06/17 1453 01/11/17 0956  AST 126*  --   --   ALT 59  --   --   ALKPHOS 876*  --   --   BILITOT 14.6*  --   --   PROT 4.4*  --   --   ALBUMIN <1.0* <1.0* <1.0*   Coagulation Profile:  Recent Labs Lab 01/11/17 0544  INR 1.65   Cardiac Enzymes: No results for input(s): CKTOTAL, CKMB, CKMBINDEX, TROPONINI in the last 168 hours. CBG:  Recent Labs Lab 01/10/17 1240 01/10/17 1736 01/10/17 2247 01/11/17 0902 01/11/17 0952  GLUCAP 138* 74 81 48* 67    No results found for this or any previous visit (from the past 240 hour(s)).   Radiology Studies: No results found.   Scheduled Meds: . feeding supplement  1 Container Oral TID WC  . feeding supplement (PRO-STAT SUGAR FREE 64)  30 mL Oral BID  . heparin lock flush  500 Units Intracatheter Q30 days  . lipase/protease/amylase  72,000 Units Oral TID AC  . pantoprazole  40 mg Oral BID  . sodium chloride flush  10-40 mL Intracatheter Q12H   Continuous Infusions: . sodium chloride 1,000 mL (01/03/17 1324)  . sodium chloride    . sodium chloride       LOS: 14 days    Domenic Polite, MD, [facility] Pager [phone] If 7PM-7AM, please contact night-coverage [email or web address] Password [email or web address] 01/11/2017, 1:26 PM

## 2017-01-12 ENCOUNTER — Inpatient Hospital Stay (HOSPITAL_COMMUNITY): Payer: BLUE CROSS/BLUE SHIELD

## 2017-01-12 LAB — CBC
HCT: 22 % — ABNORMAL LOW (ref 39.0–52.0)
Hemoglobin: 7.4 g/dL — ABNORMAL LOW (ref 13.0–17.0)
MCH: 28.5 pg (ref 26.0–34.0)
MCHC: 33.6 g/dL (ref 30.0–36.0)
MCV: 84.6 fL (ref 78.0–100.0)
PLATELETS: 155 10*3/uL (ref 150–400)
RBC: 2.6 MIL/uL — ABNORMAL LOW (ref 4.22–5.81)
RDW: 16.6 % — AB (ref 11.5–15.5)
WBC: 15.3 10*3/uL — ABNORMAL HIGH (ref 4.0–10.5)

## 2017-01-12 LAB — BASIC METABOLIC PANEL
Anion gap: 15 (ref 5–15)
BUN: 33 mg/dL — ABNORMAL HIGH (ref 6–20)
CALCIUM: 7.9 mg/dL — AB (ref 8.9–10.3)
CO2: 22 mmol/L (ref 22–32)
CREATININE: 6.71 mg/dL — AB (ref 0.61–1.24)
Chloride: 94 mmol/L — ABNORMAL LOW (ref 101–111)
GFR calc non Af Amer: 8 mL/min — ABNORMAL LOW (ref >60)
GFR, EST AFRICAN AMERICAN: 10 mL/min — AB (ref >60)
GLUCOSE: 70 mg/dL (ref 65–99)
Potassium: 4.2 mmol/L (ref 3.5–5.1)
Sodium: 131 mmol/L — ABNORMAL LOW (ref 135–145)

## 2017-01-12 LAB — GLUCOSE, CAPILLARY
GLUCOSE-CAPILLARY: 91 mg/dL (ref 65–99)
Glucose-Capillary: 52 mg/dL — ABNORMAL LOW (ref 65–99)
Glucose-Capillary: 63 mg/dL — ABNORMAL LOW (ref 65–99)
Glucose-Capillary: 108 mg/dL — ABNORMAL HIGH (ref 65–99)
Glucose-Capillary: 70 mg/dL (ref 65–99)

## 2017-01-12 MED ORDER — GLUCOSE 40 % PO GEL: 1.0000 | Freq: Once | ORAL | Status: AC

## 2017-01-12 MED ORDER — DEXTROSE 50 % IV SOLN
INTRAVENOUS | Status: AC
  Filled 2017-01-12: qty 50

## 2017-01-12 MED ORDER — GLUCOSE 40 % PO GEL
ORAL | Status: AC
  Administered 2017-01-12: 09:00:00
  Filled 2017-01-12: qty 1

## 2017-01-12 MED ORDER — GLUCOSE 40 % PO GEL
ORAL | Status: AC
  Administered 2017-01-12: 37.5 g
  Filled 2017-01-12: qty 1

## 2017-01-12 MED ORDER — GLUCOSE 40 % PO GEL
1.0000 | Freq: Once | ORAL | Status: AC
  Administered 2017-01-12: 37.5 g via ORAL

## 2017-01-12 NOTE — Progress Notes (Signed)
Daily Progress Note   Patient Name: Henry Barton       Date: 01/12/2017 DOB: [birth year]  Age: 56 y.o. MRN#: [id number] Attending Physician: Domenic Polite, MD Primary Care Physician: Pcp Not In System Admit Date: 12/27/2016  Reason for Consultation/Follow-up: Establishing GOC and emotional support  Subjective: - continued conversation with patient only ( wife not at bedside) regarding diagnosis,  poor prognosis with limited treatment options.   - we discussed again the fact that there are no viable oncology treatments at this time, and his disease is progressing.  -  Currently new onset  Fever is hindering placement for a  tunneled cath for OP dialysis.  I expressed my concern that OP dialysis is going to be VERY difficult and energy depleting, and frankly close to impossible to continue   I don't beleive he will be able to access his hospice benefit at home if he continues to choice dialysis    - Intellectually  Henry Barton speaks to his limited prognosis but he is just not emotionally/spiritually ready  to shift to a full comfort path at this time.   He struggles for more time, he is concerned for his wife.  The little time he has he spends "helping her understand she will be OK without me"  - he awaits results form blood cultures and is open to treatment to treat the treatable and hopes to continue dialysis a little longer       Length of Stay: 15  Current Medications: Scheduled Meds:  . dextrose      . dextrose      . feeding supplement  1 Container Oral TID WC  . feeding supplement (PRO-STAT SUGAR FREE 64)  30 mL Oral BID  . heparin lock flush  500 Units Intracatheter Q30 days  . lipase/protease/amylase  72,000 Units Oral TID AC  . pantoprazole  40 mg Oral BID  . sodium  chloride flush  10-40 mL Intracatheter Q12H    Continuous Infusions: . sodium chloride 1,000 mL (01/03/17 1324)  . sodium chloride    . sodium chloride      PRN Meds: sodium chloride, acetaminophen, chlorproMAZINE, heparin lock flush **AND** heparin lock flush, lipase/protease/amylase, ondansetron (ZOFRAN) IV, oxyCODONE-acetaminophen, simethicone, sodium chloride flush  Physical Exam  Constitutional: He appears cachectic. He appears ill.  -  generalized weakness - anasarca  HENT:  Mouth/Throat: Oropharynx is clear and moist.  Cardiovascular: Tachycardia present.   Pulmonary/Chest: He has decreased breath sounds in the right lower field and the left lower field.  Abdominal: He exhibits distension.  Neurological: He is alert.  Skin: Skin is warm and dry.  -sclera is yellowed            Vital Signs: BP (!) 85/46 (BP Location: Left Arm)   Pulse (!) 102   Temp 98.8 F (37.1 C)   Resp 20   Ht 5\' 10"  (1.778 m)   Wt 91.3 kg (201 lb 4.8 oz)   SpO2 95%   BMI 28.88 kg/m  SpO2: SpO2: 95 % O2 Device: O2 Device: Not Delivered O2 Flow Rate: O2 Flow Rate (L/min): 4 L/min  Intake/output summary:   Intake/Output Summary (Last 24 hours) at 01/12/17 1118 Last data filed at 01/12/17 1022  Gross per 24 hour  Intake              240 ml  Output              650 ml  Net             -410 ml   LBM: Last BM Date: 01/10/17 Baseline Weight: Weight: 78.9 kg (174 lb) Most recent weight: Weight: 91.3 kg (201 lb 4.8 oz)       Palliative Assessment/Data: 30 % at best    Flowsheet Rows     Most Recent Value  Intake Tab  Referral Department  Critical care  Unit at Time of Referral  ICU  Palliative Care Primary Diagnosis  Cancer  Date Notified  12/28/16  Palliative Care Type  New Palliative care  Reason for referral  Clarify Goals of Care  Date of Admission  12/27/16  Date first seen by Palliative Care  12/28/16  # of days Palliative referral response time  0 Day(s)  # of days IP prior  to Palliative referral  1  Clinical Assessment  Palliative Performance Scale Score  20%  Psychosocial & Spiritual Assessment  Palliative Care Outcomes      Patient Active Problem List   Diagnosis Date Noted  . Malignant neoplasm of pancreas ([place])   . AKI (acute kidney injury) ([place])   . Severe protein-calorie malnutrition ([place])   . DNR (do not resuscitate)   . GI bleed 12/28/2016  . Acute renal failure (ARF) ([place])   . Pancreatic cancer metastasized to liver ([place])   . Palliative care by specialist   . DNR (do not resuscitate) discussion     Palliative Care Assessment & Plan   Patient Profile: Pancreatic cancer extensively metastatic to the liver . Treatment options even at diagnosis were limited significantly by his abnormal liver function tests with a bilirubin level of more than 15 currently.  No offered treatments at this time He also has acute on chronic renal failure requiring dialysis currently which precludes other treatment options as well .  Assessment: Continued physical and functional decline 2/2 to metastatic pancreatic cancer  Recommendations/Plan:  Treat the treatable, continue dialysis  Disposition plan will need to be assessed daily    Code Status:    Code Status Orders        Start     Ordered   01/04/17 2316  Limited resuscitation (code)  Continuous    Question Answer Comment  In the event of cardiac or respiratory ARREST: Initiate Code Blue, Call Rapid Response Yes   In  the event of cardiac or respiratory ARREST: Perform CPR Yes   In the event of cardiac or respiratory ARREST: Perform Intubation/Mechanical Ventilation No   In the event of cardiac or respiratory ARREST: Use NIPPV/BiPAp only if indicated Yes   In the event of cardiac or respiratory ARREST: Administer ACLS medications if indicated Yes   In the event of cardiac or respiratory ARREST: Perform Defibrillation or Cardioversion if indicated Yes      01/04/17 2315    Code Status History     Date Active Date Inactive Code Status Order ID Comments User Context   12/29/2016 11:54 AM 01/04/2017 11:15 PM DNR [id number]  Norman Herrlich, MD Inpatient   12/29/2016 10:41 AM 12/29/2016 11:54 AM Partial Code [id number]  Rush Farmer, MD Inpatient   12/29/2016 12:16 AM 12/29/2016 10:41 AM Full Code [id number]  Dellia Nims, MD Inpatient   12/28/2016  5:05 PM 12/29/2016 12:16 AM Partial Code [id number]  Knox Royalty, NP Inpatient   12/28/2016  3:29 PM 12/28/2016  5:05 PM DNR [id number]  Rush Farmer, MD Inpatient   12/28/2016 12:33 AM 12/28/2016  3:29 PM Partial Code [id number]  Javier Glazier, MD ED       Prognosis:  Less than 4 weeks  Discharge Planning:  To be determined  Care plan was discussed with Dr Broadus John  Thank you for allowing the Palliative Medicine Team to assist in the care of this patient.   Time In: 1100 Time Out: 1135 Total Time 35 min Prolonged Time Billed  no       Greater than 50%  of this time was spent counseling and coordinating care related to the above assessment and plan.  Wadie Lessen, NP  Please contact Palliative Medicine Team phone at [phone] for questions and concerns.

## 2017-01-12 NOTE — Progress Notes (Signed)
  [facility] Progress Note   Assessment/ Plan:   1 AKI oliguric-- on HD, will continue to support with dialysis until risks outweigh benefits, tolerated HD Wednesday (on MWF sched), next friday 2GIB w/ slow ooze-- Hgb 6.5--> 8.0, getting transfused.  No ESA due to metastatic pancreatic Ca  3 Metastatic pancreatic Ca- was previously on Xelox, Dr. Berneice Gandy at [facility] pt's oncologist.  Onc consulted, greatly appreciate assistance 4 Anemia PRBCs prn 5.  Fever: hopefully not infection, hopefully tumor fever but still a bad prognostic sign.  Have discussed that should he have shock, transfer to ICU for pressors/ aggressive care probably would not result in greater longevity. 5.  Dispo: anticipating discharge to hospice with dialysis support until risks outweigh benefits.    Subjective:     Fever today.  Couldn't get TDC   Objective:   BP (!) 85/46 (BP Location: Left Arm)   Pulse (!) 102   Temp 98.8 F (37.1 C)   Resp 20   Ht 5\' 10"  (1.778 m)   Wt 91.3 kg (201 lb 4.8 oz)   SpO2 95%   BMI 28.88 kg/m   Physical Exam: CBU:LAGTXMIWO, appears uncomfortable HEENT: + jaundice CVS:RRR Resp:unlabored Abd: + ascites Ext:3+ LE edema ACCESS: R IJ nontunneled HD cath  Labs: BMET  Recent Labs Lab 01/06/17 1453 01/08/17 1041 01/09/17 0118 01/10/17 0438 01/11/17 0956 01/12/17 0430  NA 131* 131* 130* 132* 130* 131*  K 4.8 4.3 4.6 4.4 4.8 4.2  CL 93* 95* 95* 95* 93* 94*  CO2 23 22 20* 22 22 22   GLUCOSE 122* 104* 68 87 74 70  BUN 53* 58* 64* 43* 53* 33*  CREATININE 8.18* 8.43* 9.39* 7.12* 9.41* 6.71*  CALCIUM 8.0* 8.0* 7.6* 7.8* 8.2* 7.9*  PHOS 6.0*  --   --   --  7.7*  --    CBC  Recent Labs Lab 01/09/17 0118 01/10/17 0438 01/11/17 0956 01/12/17 0430  WBC 10.4 8.2 12.2* 15.3*  HGB 6.5* 8.5* 7.8* 7.4*  HCT 19.2* 24.4* 23.0* 22.0*  MCV 83.8 83.8 83.9 84.6  PLT 186 202 156 155    @IMGRELPRIORS @ Medications:    . dextrose      . feeding supplement  1  Container Oral TID WC  . feeding supplement (PRO-STAT SUGAR FREE 64)  30 mL Oral BID  . heparin lock flush  500 Units Intracatheter Q30 days  . lipase/protease/amylase  72,000 Units Oral TID AC  . pantoprazole  40 mg Oral BID  . sodium chloride flush  10-40 mL Intracatheter Q12H     Madelon Lips MD 01/12/2017, 12:58 PM

## 2017-01-12 NOTE — Progress Notes (Signed)
PROGRESS NOTE   Henry Barton  HXT:[id number]    DOB: [birth year]    DOA: 12/27/2016  PCP: Pcp Not In System  Brief Narrative:  56 year old male with a PMH of pancreatic cancer with metastases to liver, s/p chemotherapy under the care of Dr. Berneice Gandy at [facility] (diagnosed 11/10/16 by liver biopsy, started palliative chemotherapy 11/29/16), HTN, remote history of PUD, anemia requiring outpatient transfusions, presented to ED on 3/18 with 3 days history of lack of energy, blood in stools and coffee-ground emesis. He was admitted by CCM to ICU for upper GI bleed, hemorrhagic shock and acute renal failure requiring CRRT. After stabilization, care was transferred to telemetry and TRH on 4/10. Nephrology following. Patient currently has non-curative pancreatic cancer metastasized to his liver. Oncologist was called for second opinion who currently is recommending hospice. Palliative following and assisting with care  Assessment & Plan:   1. Acute upper GI bleed: Likely related to metastatic pancreatic cancer and mass erosion into GI tract +/- PUD. [facility] was consulted and recommended no EGD due to increased multifactorial risk -  continue PPI twice daily.  -  Overall poor prognosis.  -  Transfused 10 units PRBC  2. Acute blood loss anemia: Secondary to GI bleed.  -s/p 10 units PRBC so far -transfuse to keep Hb >7, last transfusion 4/16  3. Fever 102.4 overnight - no obvious source and BP soft -check blood Cx and CXR -plan to delay tunneled HD cath due to this  4. Acute kidney injury with volume overload and third spacing - Initially was on CRRT but was transitioned to intermittent HD on 4/9  - Renal following - overall prognosis very poor  - continuing HD for now until BP tolerates, we should continue to recommend stopping HD - Fever of 102.4 overnight, IR concerned with placing tunneled cath in this setting, plan to delay this for now, check Blood Cx and CXR, monitor   5. Metastatic pancreatic  cancer/ jaundice: - managed by Dr Berneice Gandy at [facility].  -he was started on palliative chemotherapy with Xelox. Did not have to many treatment options due to extensive liver involvement with significant elevated liver function tests and bilirubin more than 5 and also some element of chronic kidney disease, he was told that nothing else to offer -s/p Second opinion per Dr.Kale on 4/16, he was again told abt grave prognosis of days to weeks and from an oncology perspective he strongly recommend moving towards hospice care  6. Severe malnutrition in the context of chronic illness:  -on protein supplements  7. Chronic pain: treat supportively   DVT prophylaxis: SCDs Code Status: DO NOT RESUSCITATE Family Communication: wife at bedside 4/18 Disposition: Palliative on board and assisting with disposition, Home with Hospice is appropriate when pt agrees  Consultants:  [facility]-signed off [place] off Nephrology  Palliative Oncology  Procedures:  HD PRBC transfusions  Antimicrobials:  None   Subjective: Fever overnight, some cough/phlegm last pm  Objective:  Vitals:   01/12/17 0603 01/12/17 0608 01/12/17 0610 01/12/17 0849  BP:  (!) 87/53 (!) 89/50 (!) 85/46  Pulse:  (!) 106 (!) 106 (!) 102  Resp:  16  20  Temp:  98.3 F (36.8 C)  98.8 F (37.1 C)  TempSrc:      SpO2:  (!) 88% 98% 95%  Weight: 91.3 kg (201 lb 4.8 oz)     Height:        Examination:  Gen:  chronically ill appearing male, laying in  bed  Lungs: diminished BS at bases. Right side neck HD catheter.Port-A-Cath +. Cardiovascular system: S1 & S2 heard, RRR.  Abd: soft, distended, nontender. Epigastric/RUQ masses felt. Normal bowel sounds heard. Possible ascites. Central nervous system: Alert and oriented. No focal neurological deficits. Extremities: Symmetric 5 x 5 power, 3plus edema extending to R thigh Skin: No rashes, lesions or ulcers Psychiatry: Judgement and insight appear normal. Mood & affect  appropriate.   Data Reviewed: I have personally reviewed following labs and imaging studies  CBC:  Recent Labs Lab 01/08/17 1041 01/09/17 0118 01/10/17 0438 01/11/17 0956 01/12/17 0430  WBC 10.7* 10.4 8.2 12.2* 15.3*  HGB 7.6* 6.5* 8.5* 7.8* 7.4*  HCT 21.7* 19.2* 24.4* 23.0* 22.0*  MCV 85.4 83.8 83.8 83.9 84.6  PLT 189 186 202 156 893   Basic Metabolic Panel:  Recent Labs Lab 01/06/17 1453 01/08/17 1041 01/09/17 0118 01/10/17 0438 01/11/17 0956 01/12/17 0430  NA 131* 131* 130* 132* 130* 131*  K 4.8 4.3 4.6 4.4 4.8 4.2  CL 93* 95* 95* 95* 93* 94*  CO2 23 22 20* 22 22 22   GLUCOSE 122* 104* 68 87 74 70  BUN 53* 58* 64* 43* 53* 33*  CREATININE 8.18* 8.43* 9.39* 7.12* 9.41* 6.71*  CALCIUM 8.0* 8.0* 7.6* 7.8* 8.2* 7.9*  PHOS 6.0*  --   --   --  7.7*  --    Liver Function Tests:  Recent Labs Lab 01/05/17 1044 01/06/17 1453 01/11/17 0956  AST 126*  --   --   ALT 59  --   --   ALKPHOS 876*  --   --   BILITOT 14.6*  --   --   PROT 4.4*  --   --   ALBUMIN <1.0* <1.0* <1.0*   Coagulation Profile:  Recent Labs Lab 01/11/17 0544  INR 1.65   Cardiac Enzymes: No results for input(s): CKTOTAL, CKMB, CKMBINDEX, TROPONINI in the last 168 hours. CBG:  Recent Labs Lab 01/11/17 0952 01/11/17 1716 01/11/17 2216 01/12/17 0805 01/12/17 0847  GLUCAP 67 64* 80 52* 63*    No results found for this or any previous visit (from the past 240 hour(s)).   Radiology Studies: No results found.   Scheduled Meds: . dextrose      . dextrose      . feeding supplement  1 Container Oral TID WC  . feeding supplement (PRO-STAT SUGAR FREE 64)  30 mL Oral BID  . heparin lock flush  500 Units Intracatheter Q30 days  . lipase/protease/amylase  72,000 Units Oral TID AC  . pantoprazole  40 mg Oral BID  . sodium chloride flush  10-40 mL Intracatheter Q12H   Continuous Infusions: . sodium chloride 1,000 mL (01/03/17 1324)  . sodium chloride    . sodium chloride       LOS:  15 days    Domenic Polite, MD, [facility] Page via [email or web address], password [email or web address] If 7PM-7AM, please contact night-coverage [email or web address] Password [email or web address] 01/12/2017, 8:54 AM

## 2017-01-12 NOTE — Care Management Note (Signed)
Case Management Note  Patient Details  Name: Henry Barton MRN: [id number] Date of Birth: [birth year]  Subjective/Objective:   Pt admitted with pancreatic cancer with mets to liver                 Action/Plan:   PTA from home with wife.  Palliative Care working with family on [place] as prognosis is very poor.  CM will continue to follow for discharge needs   Expected Discharge Date:   (unknown)               Expected Discharge Plan:     In-House Referral:     Discharge planning Services  CM Consult  Post Acute Care Choice:    Choice offered to:     DME Arranged:    DME Agency:     HH Arranged:    HH Agency:     Status of Service:     If discussed at [employer], dates discussed:    Additional Comments: Pt is now on 5w - Palliative closely following to help pt determine if he will discharge home with hospice or to a residential hospice . Palliative to follow up with CM regarding decision post today's meeting.  CSW aware of potential residential hospice Maryclare Labrador, [place] 01/12/2017, 11:08 AM

## 2017-01-13 DIAGNOSIS — N186 End stage renal disease: Secondary | ICD-10-CM

## 2017-01-13 DIAGNOSIS — M7989 Other specified soft tissue disorders: Secondary | ICD-10-CM

## 2017-01-13 LAB — BASIC METABOLIC PANEL
Anion gap: 17 — ABNORMAL HIGH (ref 5–15)
BUN: 49 mg/dL — AB (ref 6–20)
CALCIUM: 8 mg/dL — AB (ref 8.9–10.3)
CHLORIDE: 93 mmol/L — AB (ref 101–111)
CO2: 22 mmol/L (ref 22–32)
CREATININE: 8.49 mg/dL — AB (ref 0.61–1.24)
GFR calc non Af Amer: 6 mL/min — ABNORMAL LOW (ref >60)
GFR, EST AFRICAN AMERICAN: 7 mL/min — AB (ref >60)
GLUCOSE: 78 mg/dL (ref 65–99)
Potassium: 4.4 mmol/L (ref 3.5–5.1)
Sodium: 132 mmol/L — ABNORMAL LOW (ref 135–145)

## 2017-01-13 LAB — CBC
HCT: 21.9 % — ABNORMAL LOW (ref 39.0–52.0)
Hemoglobin: 7.4 g/dL — ABNORMAL LOW (ref 13.0–17.0)
MCH: 28.4 pg (ref 26.0–34.0)
MCHC: 33.8 g/dL (ref 30.0–36.0)
MCV: 83.9 fL (ref 78.0–100.0)
PLATELETS: 152 10*3/uL (ref 150–400)
RBC: 2.61 MIL/uL — AB (ref 4.22–5.81)
RDW: 16.7 % — AB (ref 11.5–15.5)
WBC: 18.1 10*3/uL — ABNORMAL HIGH (ref 4.0–10.5)

## 2017-01-13 LAB — GLUCOSE, CAPILLARY
GLUCOSE-CAPILLARY: 80 mg/dL (ref 65–99)
Glucose-Capillary: 49 mg/dL — ABNORMAL LOW (ref 65–99)
Glucose-Capillary: 79 mg/dL (ref 65–99)

## 2017-01-13 NOTE — Progress Notes (Signed)
Procedure for perm cath placement remains on hold due to elevation in his WBC.  We will continue to follow and can plan for placement once WBC improving.  Also noted Dr. Bishop [place] note today regarding risk vs benefits of continuing HD.  Will follow.  Nolan Lasser E 9:57 AM 01/13/2017

## 2017-01-13 NOTE — Care Management Note (Signed)
Case Management Note  Patient Details  Name: MARKEEM NOREEN MRN: [id number] Date of Birth: Nov 01, 1960  Subjective/Objective:                  Advanced Pancreatic cancer with metastases to liver and no treatment options per [facility] oncology and here, and AKI now on Acute HD, discussed stopping HD with pt again this am, he thinks he is ready to stop, but he will talk to his wife abt this further. DNR, he agrees to this and understands that anything else is FUTILE. Metastatic CA. Palliative following. Requiring inpatient HD. Currently new onset fever is hindering placement for a  tunneled cath for OP dialysis.  Palliatve expressed concern that OP dialysis is going to be VERY difficult and energy depleting, and frankly close to impossible to continue. He may not be able to access his hospice benefit at home if he continues to choose dialysis and curative treatment plan.  Action/Plan:  Home hospice vs residential hospice. Palliative meeting daily.  Barriers- desire to continue w HD, would need to be able to tolerate, get catheter and be clipped. Latest notes reflect patient to discuss stopping HD with wife.   CM will continue to follow.   Expected Discharge Date:   (unknown)               Expected Discharge Plan:     In-House Referral:     Discharge planning Services  CM Consult  Post Acute Care Choice:    Choice offered to:     DME Arranged:    DME Agency:     HH Arranged:    HH Agency:     Status of Service:  In process, will continue to follow  If discussed at Long Length of Stay Meetings, dates discussed:    Additional Comments:  Carles Collet, RN 01/13/2017, 4:55 PM

## 2017-01-13 NOTE — Progress Notes (Addendum)
[facility] Progress Note   Assessment/ Plan:   1 AKI oliguric--> ESRD.  Given comorbidities, I do not expect renal recovery.  on HD, will continue to support with dialysis until risks outweigh benefits, tolerated HD Wednesday (on MWF sched), scheduled for today.  We are very close to risks of HD outweighing benefits given pt's hemodynamics.  I have told pt this today and he understands what that means.   2GIB w/ slow ooze-- transfuse for Hgb < 7.0.  No ESA due to metastatic cancer 3 Metastatic pancreatic Ca- was previously on Xelox, Dr. Berneice Gandy at [facility] pt's oncologist.  Onc consulted, greatly appreciate assistance.  There are no more chemo options for him.  I asked him who he's holding on for- he said unequivocally his wife.  She's been having a very difficult time coping and he wants to make sure she's OK.  I greatly appreciate palliative care- I think she needs dedicated grief counseling.  I will try to see her today and express my concerns over dialysis. 4 Anemia PRBCs prn 5.  Fever: hopefully not infection, hopefully tumor fever but still a bad prognostic sign.  Have discussed that should he have shock, transfer to ICU for pressors/ aggressive care probably would not result in greater longevity. 5.  Dispo: anticipating discharge to hospice with dialysis support until risks outweigh benefits.    Subjective:    Cultures are negative so far.  I have noticed his mental acuity has declined over the past couple of days.  I think we are walking a fine line between the risks of dialysis outweighing the benefits- his BP drops to 80s/ 60s in HD and precludes UF.   Objective:   BP (!) 84/47   Pulse 96   Temp 97.6 F (36.4 C) (Oral)   Resp 17   Ht 5\' 10"  (1.778 m)   Wt 92.7 kg (204 lb 5.9 oz)   SpO2 99%   BMI 29.32 kg/m   Physical Exam: TFT:[id number] comfortable today HEENT: + jaundice CVS:RRR Resp:unlabored Abd: + ascites Ext:3+ LE edema ACCESS: R IJ nontunneled HD  cath NEURO: AAO x 3 and no asterixis but having some slower thinking  Labs: BMET  Recent Labs Lab 01/06/17 1453 01/08/17 1041 01/09/17 0118 01/10/17 0438 01/11/17 0956 01/12/17 0430 01/13/17 0412  NA 131* 131* 130* 132* 130* 131* 132*  K 4.8 4.3 4.6 4.4 4.8 4.2 4.4  CL 93* 95* 95* 95* 93* 94* 93*  CO2 23 22 20* 22 22 22 22   GLUCOSE 122* 104* 68 87 74 70 78  BUN 53* 58* 64* 43* 53* 33* 49*  CREATININE 8.18* 8.43* 9.39* 7.12* 9.41* 6.71* 8.49*  CALCIUM 8.0* 8.0* 7.6* 7.8* 8.2* 7.9* 8.0*  PHOS 6.0*  --   --   --  7.7*  --   --    CBC  Recent Labs Lab 01/10/17 0438 01/11/17 0956 01/12/17 0430 01/13/17 0412  WBC 8.2 12.2* 15.3* 18.1*  HGB 8.5* 7.8* 7.4* 7.4*  HCT 24.4* 23.0* 22.0* 21.9*  MCV 83.8 83.9 84.6 83.9  PLT 202 156 155 152    @IMGRELPRIORS @ Medications:    . feeding supplement  1 Container Oral TID WC  . feeding supplement (PRO-STAT SUGAR FREE 64)  30 mL Oral BID  . heparin lock flush  500 Units Intracatheter Q30 days  . lipase/protease/amylase  72,000 Units Oral TID AC  . pantoprazole  40 mg Oral BID  . sodium chloride flush  10-40 mL Intracatheter Q12H  Madelon Lips MD 01/13/2017, 8:17 AM

## 2017-01-13 NOTE — Procedures (Signed)
Patient seen and examined on Hemodialysis. QB 400 via R IJ nontunneled HD cath. UF goal 1.5--> 500 cc based on hemodynamics.   Treatment adjusted as needed.  Madelon Lips MD 8:32 AM

## 2017-01-13 NOTE — Progress Notes (Signed)
I met with pt, pt's wife, and brother-in-law at bedside after dialysis.  He had hypotension in dialysis to the low 80s/ 40s.  Essentially no UF was achieved.    We discussed risks of dialysis outweighing benefits.  We are at the point where continuing dialysis puts him at undue risk of harm.  He understands this and agrees that further dialysis is precluded by hypotension.    Therefore, we will not provide any more HD.  He is DNR.  He desires his HD cath removed.  I have placed orders.    Additionally, he is interested in a paracentesis for comfort.  I will also place those orders.    He expresses the wish to be discharged to an inpatient hospice.    Madelon Lips MD [employer]

## 2017-01-13 NOTE — Progress Notes (Signed)
PROGRESS NOTE   Henry Barton  GEZ:[id number]    DOB: September 03, 1961    DOA: 12/27/2016  PCP: Pcp Not In System  Brief Narrative:  56 year old male with a PMH of pancreatic cancer with metastases to liver, s/p chemotherapy under the care of Dr. Berneice Gandy at [facility] (diagnosed 11/10/16 by liver biopsy, started palliative chemotherapy 11/29/16), HTN, remote history of PUD, anemia requiring outpatient transfusions, presented to ED on 3/18 with 3 days history of lack of energy, blood in stools and coffee-ground emesis. He was admitted by CCM to ICU for upper GI bleed, hemorrhagic shock and acute renal failure requiring CRRT. After stabilization, care was transferred to telemetry and TRH on 4/10. Nephrology following. Patient currently has non-curative pancreatic cancer metastasized to his liver. Oncologist was called for second opinion who currently is recommending hospice. Palliative following and assisting with care  Assessment & Plan:   1. ETHICS: Advanced Pancreatic cancer with metastases to liver and no treatment options per [facility] oncology and here, and AKI now on Acute HD, discussed stopping HD with pt again this am, he thinks he is ready to stop, but he will talk to his wife abt this further. -we also discussed changing from Partial CODE to DNR, he agrees to this and understands that anything else is FUTILE  2. Acute upper GI bleed: Likely related to metastatic pancreatic cancer and mass erosion into GI tract +/- PUD. [facility] was consulted and recommended no EGD due to increased multifactorial risk -  continue PPI twice daily.  -  Overall poor prognosis.  -  Transfused 10 units PRBC  3. Acute blood loss anemia: Secondary to GI bleed.  -s/p 10 units PRBC so far -transfuse to keep Hb >7, last transfusion 4/16  4. Fever 102.4 yesterday - no obvious source and BP soft, WBC remains high, hopefully this is tumor fever -blood Cx-pending and CXR with atelectasis -hold off on tunneled HD cath   5. Acute  kidney injury with volume overload and third spacing - Initially was on CRRT but was transitioned to intermittent HD on 4/9  - Renal following - overall prognosis very poor  - I had a long conversation this am and recommended stopping HD, patient is leaning towards stopping HD now and wants to talk to his wife about this -hold off on Tunneled HD cath -DNR now   6. Metastatic pancreatic cancer/ jaundice: - managed by Dr Berneice Gandy at [facility].  -he was started on palliative chemotherapy with Xelox. Did not have to many treatment options due to extensive liver involvement with significant elevated liver function tests and bilirubin more than 5 and also some element of chronic kidney disease, he was told that nothing else to offer -s/p Second opinion per Dr.Kale on 4/16, he was again told abt grave prognosis of days to weeks and from an oncology perspective he strongly recommend moving towards hospice care  7. Severe malnutrition in the context of chronic illness:  -on protein supplements  8. Chronic pain: treat supportively  DVT prophylaxis: SCDs Code Status: DO NOT RESUSCITATE Family Communication: wife at bedside 4/18 Disposition: Palliative on board and assisting with disposition, Home with Hospice soon  Consultants:  [facility]-signed off [place] off Nephrology  Palliative Oncology  Procedures:  HD PRBC transfusions  Antimicrobials:  None   Subjective: Fever overnight, some cough/phlegm last pm  Objective:  Vitals:   01/13/17 0930 01/13/17 1000 01/13/17 1030 01/13/17 1100  BP: (!) 90/43 (!) 91/55 109/69 112/62  Pulse: 97 99 (!)  109 (!) 108  Resp:    14  Temp:      TempSrc:    Oral  SpO2:      Weight:      Height:        Examination:  Gen:  chronically ill appearing male, laying in bed  Lungs: diminished BS at bases. Right side neck HD catheter.Port-A-Cath +. Cardiovascular system: S1 & S2 heard, RRR.  Abd: soft, distended, nontender. Epigastric/RUQ  masses felt. Normal bowel sounds heard. Possible ascites. Central nervous system: Alert and oriented. No focal neurological deficits. Extremities: Symmetric 5 x 5 power, 3plus edema extending to R thigh Skin: No rashes, lesions or ulcers Psychiatry: Judgement and insight appear normal. Mood & affect appropriate.   Data Reviewed: I have personally reviewed following labs and imaging studies  CBC:  Recent Labs Lab 01/09/17 0118 01/10/17 0438 01/11/17 0956 01/12/17 0430 01/13/17 0412  WBC 10.4 8.2 12.2* 15.3* 18.1*  HGB 6.5* 8.5* 7.8* 7.4* 7.4*  HCT 19.2* 24.4* 23.0* 22.0* 21.9*  MCV 83.8 83.8 83.9 84.6 83.9  PLT 186 202 156 155 027   Basic Metabolic Panel:  Recent Labs Lab 01/06/17 1453  01/09/17 0118 01/10/17 0438 01/11/17 0956 01/12/17 0430 01/13/17 0412  NA 131*  < > 130* 132* 130* 131* 132*  K 4.8  < > 4.6 4.4 4.8 4.2 4.4  CL 93*  < > 95* 95* 93* 94* 93*  CO2 23  < > 20* 22 22 22 22   GLUCOSE 122*  < > 68 87 74 70 78  BUN 53*  < > 64* 43* 53* 33* 49*  CREATININE 8.18*  < > 9.39* 7.12* 9.41* 6.71* 8.49*  CALCIUM 8.0*  < > 7.6* 7.8* 8.2* 7.9* 8.0*  PHOS 6.0*  --   --   --  7.7*  --   --   < > = values in this interval not displayed. Liver Function Tests:  Recent Labs Lab 01/06/17 1453 01/11/17 0956  ALBUMIN <1.0* <1.0*   Coagulation Profile:  Recent Labs Lab 01/11/17 0544  INR 1.65   Cardiac Enzymes: No results for input(s): CKTOTAL, CKMB, CKMBINDEX, TROPONINI in the last 168 hours. CBG:  Recent Labs Lab 01/12/17 0805 01/12/17 0847 01/12/17 1201 01/12/17 1748 01/12/17 2153  GLUCAP 52* 63* 91 108* 70    No results found for this or any previous visit (from the past 240 hour(s)).   Radiology Studies: Dg Chest 2 View  Result Date: 01/12/2017 CLINICAL DATA:  Fever.  History of pancreatic cancer. EXAM: CHEST  2 VIEW COMPARISON:  12/28/2016. FINDINGS: Right IJ line stable position. Right Port-A-Cath in stable position. Mediastinum and hilar  structures normal. Bibasilar atelectasis. Small right pleural effusion. No pneumothorax. IMPRESSION: 1.  Right IJ line and right Port-A-Cath in stable position. 2. Low lung volumes with mild bibasilar atelectasis. Atelectasis has increased slightly from prior exam. Small right pleural effusion. 3. Mild cardiomegaly. No pulmonary venous congestion . Electronically Signed   By: Marcello Moores  Register   On: 01/12/2017 11:20     Scheduled Meds: . feeding supplement  1 Container Oral TID WC  . feeding supplement (PRO-STAT SUGAR FREE 64)  30 mL Oral BID  . heparin lock flush  500 Units Intracatheter Q30 days  . lipase/protease/amylase  72,000 Units Oral TID AC  . pantoprazole  40 mg Oral BID  . sodium chloride flush  10-40 mL Intracatheter Q12H   Continuous Infusions: . sodium chloride 1,000 mL (01/03/17 1324)  . sodium chloride    .  sodium chloride       LOS: 16 days    Domenic Polite, MD, [facility] Page via [email or web address], password [email or web address] If 7PM-7AM, please contact night-coverage [email or web address] Password [email or web address] 01/13/2017, 11:20 AM

## 2017-01-13 NOTE — Progress Notes (Signed)
Patient  Refuses telemetry; said talked with Dr.  Broadus John who said it was ok to remove; no order found; notifying MD.

## 2017-01-13 NOTE — Progress Notes (Addendum)
Nephrology MD on call paged to clarify orders for pt's HD cath removal per Nephrology's note.  At 2330. On call nephrology called back. She re- entered the order for the discontinuation of the HD catheter more explicitly. IV team was able to take out the catheter. Will continue to monitor.

## 2017-01-13 NOTE — Progress Notes (Addendum)
Pt and family would like to have his HD catheter removed. IV team notified of the order. Order needs to be clarified per IV team. Attending on call MD paged. Will continue to monitor.

## 2017-01-13 NOTE — Progress Notes (Signed)
Report given to HD Nurse.

## 2017-01-14 ENCOUNTER — Inpatient Hospital Stay (HOSPITAL_COMMUNITY): Payer: BLUE CROSS/BLUE SHIELD

## 2017-01-14 LAB — GLUCOSE, CAPILLARY
GLUCOSE-CAPILLARY: 97 mg/dL (ref 65–99)
GLUCOSE-CAPILLARY: 80 mg/dL (ref 65–99)
Glucose-Capillary: 71 mg/dL (ref 65–99)
Glucose-Capillary: 111 mg/dL — ABNORMAL HIGH (ref 65–99)

## 2017-01-14 MED ORDER — GABAPENTIN 100 MG PO CAPS
100.0000 mg | ORAL_CAPSULE | Freq: Three times a day (TID) | ORAL | Status: DC
  Administered 2017-01-14 – 2017-01-15 (×4): 100 mg via ORAL
  Filled 2017-01-14 (×4): qty 1

## 2017-01-14 MED ORDER — SENNOSIDES-DOCUSATE SODIUM 8.6-50 MG PO TABS
1.0000 | ORAL_TABLET | Freq: Two times a day (BID) | ORAL | Status: DC
  Administered 2017-01-14 – 2017-01-15 (×3): 1 via ORAL
  Filled 2017-01-14 (×3): qty 1

## 2017-01-14 MED ORDER — HYDROMORPHONE HCL 1 MG/ML IJ SOLN: 0.2500 mg | INTRAMUSCULAR | Status: DC | PRN

## 2017-01-14 MED ORDER — GABAPENTIN 300 MG PO CAPS: 300.0000 mg | ORAL_CAPSULE | Freq: Every day | ORAL | Status: DC

## 2017-01-14 MED ORDER — ONDANSETRON HCL 4 MG PO TABS
4.0000 mg | ORAL_TABLET | Freq: Four times a day (QID) | ORAL | Status: DC | PRN
  Administered 2017-01-14: 4 mg via ORAL
  Filled 2017-01-14 (×2): qty 1

## 2017-01-14 MED ORDER — POLYETHYLENE GLYCOL 3350 17 G PO PACK
17.0000 g | PACK | Freq: Every day | ORAL | Status: DC
  Administered 2017-01-14 – 2017-01-15 (×2): 17 g via ORAL
  Filled 2017-01-14 (×2): qty 1

## 2017-01-14 MED ORDER — OXYCODONE HCL 5 MG PO TABS: 10.0000 mg | ORAL_TABLET | ORAL | Status: DC | PRN

## 2017-01-14 NOTE — Progress Notes (Signed)
  [facility] Progress Note   Assessment/ Plan:   1 AKI oliguric--> ESRD.  Given comorbidities, I do not expect renal recovery.  He has elected for no more dialysis.  We have removed his HD catheter. 2GIB w/ slow ooze-- transfuse for Hgb < 7.0.  No ESA due to metastatic cancer 3 Metastatic pancreatic Ca- was previously on Xelox, Dr. Berneice Gandy at [facility] pt's oncologist.  Onc consulted, greatly appreciate assistance.  There are no more chemo options for him.  I asked him who he's holding on for- he said unequivocally his wife.  She's been having a very difficult time coping and he wants to make sure she's OK.  I greatly appreciate palliative care- I think she needs dedicated grief counseling.  4 Anemia PRBCs prn 5.  Fever: treating symptomatically, cultures pending, think this may be tumor fever 6.  Dispo: anticipating discharge to inpatient hospice.  I greatly appreciate palliative care.  Subjective:    Pt has decided no more dialysis (please see my progress note from yesterday around 6 pm).  Desires inpatient hospice.  Eating breakfast this AM.   Objective:   BP (!) 90/48 (BP Location: Left Arm)   Pulse (!) 102   Temp 97.4 F (36.3 C)   Resp 18   Ht 5\' 10"  (1.778 m)   Wt 92.3 kg (203 lb 6.4 oz)   SpO2 98%   BMI 29.18 kg/m   Physical Exam: [id number] comfortable today HEENT: + jaundice CVS:RRR Resp:unlabored Abd: + ascites Ext:3+ LE edema ACCESS: R IJ nontunneled HD cath site bandaged, c/d/i NEURO: AAO x 3 and no asterixis but having some slower thinking  Labs: BMET  Recent Labs Lab 01/08/17 1041 01/09/17 0118 01/10/17 0438 01/11/17 0956 01/12/17 0430 01/13/17 0412  NA 131* 130* 132* 130* 131* 132*  K 4.3 4.6 4.4 4.8 4.2 4.4  CL 95* 95* 95* 93* 94* 93*  CO2 22 20* 22 22 22 22   GLUCOSE 104* 68 87 74 70 78  BUN 58* 64* 43* 53* 33* 49*  CREATININE 8.43* 9.39* 7.12* 9.41* 6.71* 8.49*  CALCIUM 8.0* 7.6* 7.8* 8.2* 7.9* 8.0*  PHOS  --   --   --  7.7*  --    --    CBC  Recent Labs Lab 01/10/17 0438 01/11/17 0956 01/12/17 0430 01/13/17 0412  WBC 8.2 12.2* 15.3* 18.1*  HGB 8.5* 7.8* 7.4* 7.4*  HCT 24.4* 23.0* 22.0* 21.9*  MCV 83.8 83.9 84.6 83.9  PLT 202 156 155 152    @IMGRELPRIORS @ Medications:    . feeding supplement  1 Container Oral TID WC  . feeding supplement (PRO-STAT SUGAR FREE 64)  30 mL Oral BID  . lipase/protease/amylase  72,000 Units Oral TID AC  . pantoprazole  40 mg Oral BID  . polyethylene glycol  17 g Oral Daily  . senna-docusate  1 tablet Oral BID  . sodium chloride flush  10-40 mL Intracatheter Q12H     Madelon Lips MD 01/14/2017, 9:52 AM

## 2017-01-14 NOTE — Progress Notes (Signed)
Daily Progress Note   Patient Name: Henry Barton       Date: 01/14/2017 DOB: Feb 23, 1961  Age: 56 y.o. MRN#: [id number] Attending Physician: Domenic Polite, MD Primary Care Physician: Pcp Not In System Admit Date: 12/27/2016  Reason for Consultation/Follow-up: Inpatient hospice referral, Non pain symptom management, Pain control and Psychosocial/spiritual support  Subjective: Chart reviewed. Patient has been unable to tolerate further hemodialysis because of hyportension. He has since talked to Dr. Hollie Salk, with nephrology, and has elected to stop dialysis. He is now a DO NOT RESUSCITATE. He requested that his hemodialysis catheter be removed. Patient is interested in paracentesis for comfort and went down to IR for this on 01/14/2017. Per my discussion with Dr. Hollie Salk, he is requesting inpatient hospice for end-of-life care  Length of Stay: 17  Current Medications: Scheduled Meds:  . feeding supplement  1 Container Oral TID WC  . feeding supplement (PRO-STAT SUGAR FREE 64)  30 mL Oral BID  . lipase/protease/amylase  72,000 Units Oral TID AC  . pantoprazole  40 mg Oral BID  . polyethylene glycol  17 g Oral Daily  . senna-docusate  1 tablet Oral BID  . sodium chloride flush  10-40 mL Intracatheter Q12H    Continuous Infusions: . sodium chloride    . sodium chloride      PRN Meds: acetaminophen, chlorproMAZINE, lipase/protease/amylase, ondansetron (ZOFRAN) IV, oxyCODONE, simethicone, sodium chloride flush  Physical Exam  Constitutional:  Frail acutely ill appearing older man  HENT:  Head: Normocephalic and atraumatic.  Eyes: Scleral icterus is present.  Neck: Normal range of motion.  Pulmonary/Chest: Effort normal.  Abdominal: He exhibits distension.  Musculoskeletal: He exhibits  edema.  Neurological:  Fluctuating level of consciousness. Can engage in some conversation, but rapidly falls back to sleep.  Skin: Skin is dry.  Psychiatric: His behavior is normal.  Mild confusion noted Oh agitation  Nursing note and vitals reviewed.           Vital Signs: BP (!) 90/48 (BP Location: Left Arm)   Pulse (!) 102   Temp 97.4 F (36.3 C)   Resp 18   Ht _0  (1.778 m)   Wt 92.3 kg (203 lb 6.4 oz)   SpO2 98%   BMI 29.18 kg/m  SpO2: SpO2: 98 % O2 Device: O2 Device: Not Delivered  O2 Flow Rate: O2 Flow Rate (L/min): 4 L/min  Intake/output summary:  Intake/Output Summary (Last 24 hours) at 01/14/17 1126 Last data filed at 01/14/17 1007  Gross per 24 hour  Intake              810 ml  Output                0 ml  Net              810 ml   LBM: Last BM Date: 01/10/17 Baseline Weight: Weight: 78.9 kg (174 lb) Most recent weight: Weight: 92.3 kg (203 lb 6.4 oz)       Palliative Assessment/Data:    Flowsheet Rows     Most Recent Value  Intake Tab  Referral Department  Critical care  Unit at Time of Referral  ICU  Palliative Care Primary Diagnosis  Cancer  Date Notified  12/28/16  Palliative Care Type  New Palliative care  Reason for referral  Clarify Goals of Care  Date of Admission  12/27/16  Date first seen by Palliative Care  12/28/16  # of days Palliative referral response time  0 Day(s)  # of days IP prior to Palliative referral  1  Clinical Assessment  Palliative Performance Scale Score  20%  Psychosocial & Spiritual Assessment  Palliative Care Outcomes      Patient Active Problem List   Diagnosis Date Noted  . Swollen leg   . ESRD (end stage renal disease) ([place])   . Malignant neoplasm of pancreas ([place])   . AKI (acute kidney injury) ([place])   . Severe protein-calorie malnutrition ([place])   . DNR (do not resuscitate)   . GI bleed 12/28/2016  . Acute renal failure (ARF) ([place])   . Pancreatic cancer metastasized to liver ([place])   . Palliative  care by specialist   . DNR (do not resuscitate) discussion     Palliative Care Assessment & Plan   Patient Profile: 56 year old male with a PMH of pancreatic cancer with metastases to liver, s/p chemotherapy under the care of Dr. Berneice Gandy at [facility] (diagnosed 11/10/16 by liver biopsy, started palliative chemotherapy 11/29/16), HTN, remote history of PUD, anemia requiring outpatient transfusions, presented to ED on 3/18 with 3 days history of lack of energy, blood in stools and coffee-ground emesis. He was admitted by CCM to ICU for upper GI bleed, hemorrhagic shock and acute renal failure requiring CRRT. After stabilization, care was transferred to telemetry and TRH on 4/10. Nephrology following. Patient currently has non-curative pancreatic cancer metastasized to his liver. Oncologist was called for second opinion who currently is recommending hospice. Palliative following and assisting with care. He has now elected to stop hemodialysis because of hypotension and move to hospice  Assessment: Met with patient, patient's wife, patient daughter as well as mother. All are aware of stopping hemodialysis and support patient in transfer to inpatient hospice.  Recommendations/Plan:  Transfer to inpatient  Hospice. Order has been placed social work. Discussed options for [facility] and hospice home with [facility]. Family is planning to go tour and are ready for transfer as soon as a bed is available in either location  Continue with oxycodone 5 mg every 4 hours as needed by mouth for pain ; will add gabapentin 100 mg 3 times a day for both relief of hiccups as well as neuropathic pain. We'll also add low-dose IV Dilaudid at 0.25 mg every 2 hours as needed for acute shortness of breath or pain  Goals of Care and Additional Recommendations:  Limitations on Scope of Treatment: Minimize Medications, No Artificial Feeding, No Chemotherapy, No Hemodialysis, No Radiation, No Surgical Procedures and No  Tracheostomy  Code Status:    Code Status Orders        Start     Ordered   01/13/17 1127  Do not attempt resuscitation (DNR)  Continuous    Question Answer Comment  In the event of cardiac or respiratory ARREST Do not call a "code blue"   In the event of cardiac or respiratory ARREST Do not perform Intubation, CPR, defibrillation or ACLS   In the event of cardiac or respiratory ARREST Use medication by any route, position, wound care, and other measures to relive pain and suffering. May use oxygen, suction and manual treatment of airway obstruction as needed for comfort.      01/13/17 1126    Code Status History    Date Active Date Inactive Code Status Order ID Comments User Context   01/04/2017 11:15 PM 01/13/2017 11:26 AM Partial Code [id number]  Gardiner Barefoot, NP Inpatient   12/29/2016 11:54 AM 01/04/2017 11:15 PM DNR [id number]  Norman Herrlich, MD Inpatient   12/29/2016 10:41 AM 12/29/2016 11:54 AM Partial Code [id number]  Rush Farmer, MD Inpatient   12/29/2016 12:16 AM 12/29/2016 10:41 AM Full Code [id number]  Dellia Nims, MD Inpatient   12/28/2016  5:05 PM 12/29/2016 12:16 AM Partial Code [id number]  Knox Royalty, NP Inpatient   12/28/2016  3:29 PM 12/28/2016  5:05 PM DNR [id number]  Rush Farmer, MD Inpatient   12/28/2016 12:33 AM 12/28/2016  3:29 PM Partial Code [id number]  Javier Glazier, MD ED       Prognosis:   < 2 weeks in the setting of metastatic pancreatic cancer and renal failure. Patient is stopping hemodialysis; developing encephalopathy  Discharge Planning:  Hospice facility  Care plan was discussed with Dr. Hollie Salk  Thank you for allowing the Palliative Medicine Team to assist in the care of this patient.   Time In: 1600 Time Out: 1645 Total Time 45 min Prolonged Time Billed  no       Greater than 50%  of this time was spent counseling and coordinating care related to the above assessment and plan.  Dory Horn, NP  Please contact Palliative Medicine  Team phone at [phone] for questions and concerns.

## 2017-01-14 NOTE — Progress Notes (Signed)
Patient ID: Henry Barton, male   DOB: 05-May-1961, 56 y.o.   MRN: [id number] Patient presented to ultrasound department today for therapeutic paracentesis. On limited ultrasound of abdomen in all 4 quadrants there is only a trace amount of ascites in the perihepatic region. Paracentesis was not performed. Patient informed.

## 2017-01-14 NOTE — Progress Notes (Signed)
PROGRESS NOTE   Henry Barton  QIH:[id number]    DOB: Oct 23, 1960    DOA: 12/27/2016  PCP: Pcp Not In System  Brief Narrative:  56 year old male with a PMH of pancreatic cancer with metastases to liver, s/p chemotherapy under the care of Dr. Berneice Gandy at [facility] (diagnosed 11/10/16 by liver biopsy, started palliative chemotherapy 11/29/16), HTN, remote history of PUD, anemia requiring outpatient transfusions, presented to ED on 3/18 with 3 days history of lack of energy, blood in stools and coffee-ground emesis. He was admitted by CCM to ICU for upper GI bleed, hemorrhagic shock and acute renal failure requiring CRRT. After stabilization, care was transferred to telemetry and TRH on 4/10. Nephrology following. Patient currently has non-curative pancreatic cancer metastasized to his liver. Oncologist was called for second opinion who currently is recommending hospice. Palliative following and assisting with care  Assessment & Plan:   1. ETHICS: Advanced Pancreatic cancer with metastases to liver and no treatment options per [facility] oncology and here, and AKI started on Acute HD, now finally stopped HD -DNR, CSW consulted for Residential Hospice -pt has reconciled with this  2. Acute upper GI bleed: Likely related to metastatic pancreatic cancer and mass erosion into GI tract +/- PUD. [facility] was consulted and recommended no EGD due to increased multifactorial risk -  continue PPI twice daily.  -  Overall poor prognosis.  -  Transfused 10 units PRBC  3. Acute blood loss anemia: Secondary to GI bleed.  -s/p 10 units PRBC so far -transfuse to keep Hb >7, last transfusion 4/16  4. Fever 102.4  - no obvious source and BP soft, WBC remains high, hopefully this is tumor fever -blood Cx-pending and CXR with atelectasis -improved  5. Acute kidney injury with volume overload and third spacing - Initially was on CRRT but was transitioned to intermittent HD on 4/9  - Renal following - overall prognosis very  poor  - now stopped HD - DNR now   6. Metastatic pancreatic cancer/ jaundice: - managed by Dr Berneice Gandy at [facility].  -he was started on palliative chemotherapy with Xelox. Did not have to many treatment options due to extensive liver involvement with significant elevated liver function tests and bilirubin more than 5 and also some element of chronic kidney disease, he was told that nothing else to offer -s/p Second opinion per Dr.Kale on 4/16, he was again told abt grave prognosis of days to weeks and from an oncology perspective he strongly recommend moving towards hospice care  7. Severe malnutrition in the context of chronic illness:  -on protein supplements  8. Chronic pain: treat supportively  DVT prophylaxis: SCDs Code Status: DO NOT RESUSCITATE Family Communication: wife at bedside 4/18 Disposition: DC to residential hospice soon  Consultants:  [facility]-signed off CCM-signed off Nephrology  Palliative Oncology  Procedures:  HD PRBC transfusions  Antimicrobials:  None   Subjective: Fever overnight, some cough/phlegm last pm  Objective:  Vitals:   01/13/17 1425 01/13/17 2144 01/14/17 0659 01/14/17 0700  BP: (!) 90/59 (!) 92/53 (!) 90/48   Pulse:  (!) 102 (!) 102   Resp: 19 18 18    Temp: 99.8 F (37.7 C) 98.6 F (37 C) 97.4 F (36.3 C)   TempSrc:      SpO2:  100% 98%   Weight:    92.3 kg (203 lb 6.4 oz)  Height:        Examination:  Gen:  chronically ill appearing male, laying in bed  Lungs:  diminished BS at bases. Right side neck HD catheter.Port-A-Cath +. Cardiovascular system: S1 & S2 heard, RRR.  Abd: soft, distended, nontender. Epigastric/RUQ masses felt. Normal bowel sounds heard. Possible ascites. Central nervous system: Alert and oriented. No focal neurological deficits. Extremities: Symmetric 5 x 5 power, 3plus edema extending to R thigh Skin: No rashes, lesions or ulcers Psychiatry: Judgement and insight appear normal. Mood & affect  appropriate.   Data Reviewed: I have personally reviewed following labs and imaging studies  CBC:  Recent Labs Lab 01/09/17 0118 01/10/17 0438 01/11/17 0956 01/12/17 0430 01/13/17 0412  WBC 10.4 8.2 12.2* 15.3* 18.1*  HGB 6.5* 8.5* 7.8* 7.4* 7.4*  HCT 19.2* 24.4* 23.0* 22.0* 21.9*  MCV 83.8 83.8 83.9 84.6 83.9  PLT 186 202 156 155 256   Basic Metabolic Panel:  Recent Labs Lab 01/09/17 0118 01/10/17 0438 01/11/17 0956 01/12/17 0430 01/13/17 0412  NA 130* 132* 130* 131* 132*  K 4.6 4.4 4.8 4.2 4.4  CL 95* 95* 93* 94* 93*  CO2 20* 22 22 22 22   GLUCOSE 68 87 74 70 78  BUN 64* 43* 53* 33* 49*  CREATININE 9.39* 7.12* 9.41* 6.71* 8.49*  CALCIUM 7.6* 7.8* 8.2* 7.9* 8.0*  PHOS  --   --  7.7*  --   --    Liver Function Tests:  Recent Labs Lab 01/11/17 0956  ALBUMIN <1.0*   Coagulation Profile:  Recent Labs Lab 01/11/17 0544  INR 1.65   Cardiac Enzymes: No results for input(s): CKTOTAL, CKMB, CKMBINDEX, TROPONINI in the last 168 hours. CBG:  Recent Labs Lab 01/13/17 1241 01/13/17 1626 01/13/17 2210 01/14/17 0808 01/14/17 1135  GLUCAP 49* 79 80 71 111*    Recent Results (from the past 240 hour(s))  Culture, blood (routine x 2)     Status: None (Preliminary result)   Collection Time: 01/12/17 11:12 AM  Result Value Ref Range Status   Specimen Description BLOOD RIGHT HAND  Final   Special Requests   Final    BOTTLES DRAWN AEROBIC ONLY Blood Culture results may not be optimal due to an excessive volume of blood received in culture bottles   Culture NO GROWTH 2 DAYS  Final   Report Status PENDING  Incomplete  Culture, blood (routine x 2)     Status: None (Preliminary result)   Collection Time: 01/12/17 11:12 AM  Result Value Ref Range Status   Specimen Description BLOOD RIGHT ARM  Final   Special Requests   Final    IN PEDIATRIC BOTTLE Blood Culture results may not be optimal due to an excessive volume of blood received in culture bottles   Culture NO  GROWTH 2 DAYS  Final   Report Status PENDING  Incomplete     Radiology Studies: US Abdomen Limited  Result Date: 01/14/2017 CLINICAL DATA:  Metastatic pancreatic cancer, history of ascites. Request made for therapeutic paracentesis. EXAM: LIMITED ABDOMINAL ULTRASOUND COMPARISON:  Renal ultrasound dated 12/28/2016 FINDINGS: On limited ultrasound of abdomen in all 4 quadrants today there is only a trace amount of ascites in the perihepatic region. Paracentesis was not performed. IMPRESSION: Limited ultrasound of abdomen in all 4 quadrants revealing only trace ascites in the perihepatic region. Paracentesis was not performed. Read by: Rowe Robert, PA-C Electronically Signed   By: Corrie Mckusick D.O.   On: 01/14/2017 10:53     Scheduled Meds: . feeding supplement  1 Container Oral TID WC  . feeding supplement (PRO-STAT SUGAR FREE 64)  30 mL Oral BID  .  lipase/protease/amylase  72,000 Units Oral TID AC  . pantoprazole  40 mg Oral BID  . polyethylene glycol  17 g Oral Daily  . senna-docusate  1 tablet Oral BID  . sodium chloride flush  10-40 mL Intracatheter Q12H   Continuous Infusions: . sodium chloride    . sodium chloride       LOS: 17 days    Domenic Polite, MD, [facility] Page via [email or web address], password [email or web address] If 7PM-7AM, please contact night-coverage [email or web address] Password [email or web address] 01/14/2017, 2:06 PM

## 2017-01-15 LAB — GLUCOSE, CAPILLARY
GLUCOSE-CAPILLARY: 103 mg/dL — AB (ref 65–99)
GLUCOSE-CAPILLARY: 127 mg/dL — AB (ref 65–99)
Glucose-Capillary: 48 mg/dL — ABNORMAL LOW (ref 65–99)
Glucose-Capillary: 70 mg/dL (ref 65–99)

## 2017-01-15 MED ORDER — CHLORPROMAZINE HCL 25 MG PO TABS: 25.0000 mg | ORAL_TABLET | Freq: Three times a day (TID) | ORAL | Status: AC | PRN

## 2017-01-15 MED ORDER — PANTOPRAZOLE SODIUM 40 MG PO TBEC: 40.0000 mg | DELAYED_RELEASE_TABLET | Freq: Every day | ORAL | Status: AC

## 2017-01-15 MED ORDER — POLYETHYLENE GLYCOL 3350 17 G PO PACK: 17.0000 g | PACK | Freq: Every day | ORAL | 0 refills | Status: AC

## 2017-01-15 MED ORDER — OXYCODONE HCL 10 MG PO TABS: 10.0000 mg | ORAL_TABLET | ORAL | 0 refills | Status: AC | PRN

## 2017-01-15 NOTE — Clinical Social Work Note (Signed)
Clinical Social Worker facilitated patient discharge including contacting patient family and facility to confirm patient discharge plans.  Clinical information faxed to facility and family agreeable with plan.  CSW arranged ambulance transport via [facility] to [place].  RN to call report prior to discharge.  Clinical Social Worker will sign off for now as social work intervention is no longer needed. Please consult [place] again if new need arises.  Barbette Or, [place]

## 2017-01-15 NOTE — Progress Notes (Signed)
Briefly reviewed chart.    Henry Barton was taken for paracentesis yesterday but there was only trace ascites so para wasn't performed.   He is going to be discharged to a residential hospice.    He is no longer receiving dialysis.    Nephrology will sign off.    Please don't hesitate to contact us with any questions or concerns.  Madelon Lips MD [employer]

## 2017-01-15 NOTE — Progress Notes (Signed)
This AM patient's CBG was 48. Patient being alert and oriented, with no distress. He received orange juice and CBG come up to 70. Patient was encouraged to eat breakfast. MD made aware. Will continue to monitor.

## 2017-01-15 NOTE — Discharge Summary (Addendum)
Physician Discharge Summary  Henry Barton QIO:[id number] DOB: [birth year] DOA: 12/27/2016  PCP: Pcp Not In System  Admit date: 12/27/2016 Discharge date: 01/15/2017  Time spent: 35 minutes  Recommendations for Outpatient Follow-up:  1. Residential Hospice for End of life care   Discharge Diagnoses:  Active Problems:   GI bleed   Acute renal failure (ARF) (HCC)   Acute blood loss anemia   Pancreatic cancer metastasized to liver [facility])   Palliative care by specialist   DNR (do not resuscitate) discussion   DNR (do not resuscitate)   AKI (acute kidney injury) ([place])   Severe protein-calorie malnutrition ([place])   Malignant neoplasm of pancreas ([place])   Swollen leg   ESRD (end stage renal disease) ([place])   Discharge Condition: poor  Diet recommendation: comfort feeds  Filed Weights   01/12/17 0603 01/13/17 0717 01/14/17 0700  Weight: 91.3 kg (201 lb 4.8 oz) 92.7 kg (204 lb 5.9 oz) 92.3 kg (203 lb 6.4 oz)    History of present illness:  56 year old male with a PMH of pancreatic cancer with metastases to liver, s/p chemotherapy under the care of Dr. Berneice Gandy at [facility] (diagnosed 11/10/16 by liver biopsy, started palliative chemotherapy 11/29/16), HTN, remote history of PUD, anemia requiring outpatient transfusions, presented to ED on 3/18 with 3 days history of lack of energy, blood in stools and coffee-ground emesis. He was admitted by CCM to ICU for upper GI bleed, hemorrhagic shock and acute renal failure requiring CRRT  Hospital Course:   1. ETHICS: -Advanced Pancreatic cancer with metastases to liver and no treatment options per [facility] oncology and here, and AKI started on CRRT and intermittent Hemodialysis in ICU -Palliative consulted, overall prognosis very poor and finally decision made to stop HD/comfort care and discharge to residential hospice for Comfort care  2. Acute upper GI bleed: Likely related to metastatic pancreatic cancer and mass erosion into GI tract +/- PUD. [facility]  was consulted and recommended no EGD due to increased multifactorial risk -  continue PPI twice daily.  -  Overall poor prognosis.  -  Transfused 10 units PRBC  3. Acute blood loss anemia: Secondary to GI bleed.  -s/p 10 units PRBC so far -hb 7.4 on last check  4. Fever 102.4  - no obvious source and BP soft, WBC remains high, hopefully this is tumor fever -blood Cx-negative thus far and CXR with atelectasis -improved  5. Acute kidney injury with volume overload and third spacing - Initially was on CRRT but was transitioned to intermittent HD on 4/9  - followed by Renal this admission - overall prognosis very poor, now stopped HD - comfort care  6. Metastatic pancreatic cancer/ jaundice: - managed by Dr Berneice Gandy at [facility].  -he was started on palliative chemotherapy with Xelox. Did not have to many treatment options due to extensive liver involvement with significant elevated liver function tests and bilirubin more than 5 and also some element of chronic kidney disease, he was told that nothing else to offer -s/p Second opinion per Dr.Kale on 4/16, he was again told abt grave prognosis of days to weeks and from anoncology perspective he strongly recommend moving towards hospice care  7. Severe malnutrition in the context of chronic illness:  -on protein supplements  8. Chronic pain: treat supportively  Consultations: [facility]-signed off CCM-signed off Nephrology  Palliative Oncology  Discharge Exam: Vitals:   01/14/17 2140 01/15/17 0659  BP: (!) 96/54 (!) 90/47  Pulse: (!) 109 (!) 112  Resp: 18 17  Temp: 98.5 F (36.9 C) 99.6 F (37.6 C)    General: AAOx2, very frail Cardiovascular: S1S2/tachycardic Respiratory: decreased BS at bases  Discharge Instructions   Discharge Instructions    Diet - low sodium heart healthy    Complete by:  As directed    Increase activity slowly    Complete by:  As directed      Current Discharge Medication List     START taking these medications   Details  chlorproMAZINE (THORAZINE) 25 MG tablet Take 1 tablet (25 mg total) by mouth 3 (three) times daily as needed for hiccoughs.    oxyCODONE 10 MG TABS Take 1 tablet (10 mg total) by mouth every 4 (four) hours as needed for moderate pain or severe pain. Qty: 30 tablet, Refills: 0    pantoprazole (PROTONIX) 40 MG tablet Take 1 tablet (40 mg total) by mouth daily.    polyethylene glycol (MIRALAX / GLYCOLAX) packet Take 17 g by mouth daily. Refills: 0      CONTINUE these medications which have NOT CHANGED   Details  gabapentin (NEURONTIN) 100 MG capsule Take 100 mg by mouth 3 (three) times daily. Hiccups Refills: 0    lidocaine-prilocaine (EMLA) cream Apply 1 application topically as directed. Refills: 2    ondansetron (ZOFRAN) 8 MG tablet Take 8 mg by mouth every 12 (twelve) hours as needed for nausea/vomiting.      STOP taking these medications     capecitabine (XELODA) 500 MG tablet      CREON 24000-76000 units CPEP      dexamethasone (DECADRON) 4 MG tablet      Multiple Vitamin (MULTIVITAMIN) capsule      naproxen (NAPROSYN) 500 MG tablet        No Known Allergies    The results of significant diagnostics from this hospitalization (including imaging, microbiology, ancillary and laboratory) are listed below for reference.    Significant Diagnostic Studies: Ct Abdomen Pelvis Wo Contrast  Result Date: 12/30/2016 CLINICAL DATA:  GI bleed.  Is pancreatic cancer. EXAM: CT ABDOMEN AND PELVIS WITHOUT CONTRAST TECHNIQUE: Multidetector CT imaging of the abdomen and pelvis was performed following the standard protocol without IV contrast. COMPARISON:  Abdomen CT 11/02/2016 FINDINGS: Lower chest: Subsegmental atelectasis noted right lower lobe. 5 mm perifissural nodule seen in the right lower lung on image 1 series 4. Hepatobiliary: Bulky liver lesions are again noted, poorly defined given the lack of intravenous contrast material. Some of  the lesions probably measure up to 7-8 cm. Lesions appear confluent in some areas. Gallbladder not visualized. Insert no biliary Pancreas: Pancreatic head mass seen previously at 4.0 x 2.9 cm now measures 3.8 x 3.1 cm Spleen: No splenomegaly. No focal mass lesion. Adrenals/Urinary Tract: No adrenal nodule or mass. No hydronephrosis in either kidney. Bladder moderately distended. Stomach/Bowel: Stomach is nondistended. No evidence for small bowel obstruction. No colonic obstruction. Vascular/Lymphatic: No abdominal aortic aneurysm. No definite lymphadenopathy in the abdomen or pelvis. Reproductive: The prostate gland and seminal vesicles have normal imaging features. Other: Small to moderate volume intraperitoneal free fluid is evident. Musculoskeletal: Diffuse body wall and mesenteric edema is noted. Bone windows reveal no worrisome lytic or sclerotic osseous lesions. IMPRESSION: Limited study due to lack of intravenous contrast and cachexia. Bulky hepatic metastases with no substantial change in size of the pancreatic head mass. Small moderate volume ascites with diffuse body wall edema. Electronically Signed   By: Misty Stanley M.D.   On: 12/30/2016 15:33  Dg Chest 2 View  Result Date: 01/12/2017 CLINICAL DATA:  Fever.  History of pancreatic cancer. EXAM: CHEST  2 VIEW COMPARISON:  12/28/2016. FINDINGS: Right IJ line stable position. Right Port-A-Cath in stable position. Mediastinum and hilar structures normal. Bibasilar atelectasis. Small right pleural effusion. No pneumothorax. IMPRESSION: 1.  Right IJ line and right Port-A-Cath in stable position. 2. Low lung volumes with mild bibasilar atelectasis. Atelectasis has increased slightly from prior exam. Small right pleural effusion. 3. Mild cardiomegaly. No pulmonary venous congestion . Electronically Signed   By: Marcello Moores  Register   On: 01/12/2017 11:20   US Abdomen Limited  Result Date: 01/14/2017 CLINICAL DATA:  Metastatic pancreatic cancer, history of  ascites. Request made for therapeutic paracentesis. EXAM: LIMITED ABDOMINAL ULTRASOUND COMPARISON:  Renal ultrasound dated 12/28/2016 FINDINGS: On limited ultrasound of abdomen in all 4 quadrants today there is only a trace amount of ascites in the perihepatic region. Paracentesis was not performed. IMPRESSION: Limited ultrasound of abdomen in all 4 quadrants revealing only trace ascites in the perihepatic region. Paracentesis was not performed. Read by: Rowe Robert, PA-C Electronically Signed   By: Corrie Mckusick D.O.   On: 01/14/2017 10:53   US Renal Port  Result Date: 12/28/2016 CLINICAL DATA:  Acute renal failure EXAM: RENAL / URINARY TRACT ULTRASOUND COMPLETE COMPARISON:  CT from 11/02/2016, ultrasound from 10/21/2016 FINDINGS: Right Kidney: Length: 9.9 cm. Increased echogenicity. Mild caliectasis of the upper pole. No mass or hydronephrosis visualized. Left Kidney: Length: 11.8 cm. Increased echogenicity. No mass or hydronephrosis visualized. Bladder: Nondistended suboptimally assessed. Additional findings: Heterogeneous masslike abnormalities within the liver with adjacent ascites. Surface nodularity of the liver raises concern for cirrhosis. IMPRESSION: 1. Echogenic kidneys bilaterally consistent with medical renal disease. No obstructive uropathy. 2. Known masslike abnormalities of the liver with adjacent ascites. Electronically Signed   By: Ashley Royalty M.D.   On: 12/28/2016 02:39   Dg Chest Port 1 View  Result Date: 12/28/2016 CLINICAL DATA:  Central line placement portable chest x-ray of earlier today EXAM: PORTABLE CHEST 1 VIEW COMPARISON:  Portable chest x-ray of earlier today. FINDINGS: There has been interval placement of a large caliber right internal jugular venous catheter. The tip projects over the midportion of the SVC. There is no postprocedure pneumothorax or hemothorax. The Port-A-Cath appliance tip is again located over the distal SVC. The lungs are reasonably well inflated and clear.  The heart is top-normal in size. The pulmonary vascularity is normal. IMPRESSION: No postprocedure complication following right internal jugular venous catheter placement. Electronically Signed   By: David  Martinique M.D.   On: 12/28/2016 10:48   Dg Chest Port 1 View  Result Date: 12/28/2016 CLINICAL DATA:  Weakness and hypotension, onset around noon EXAM: PORTABLE CHEST 1 VIEW COMPARISON:  None. FINDINGS: Right-sided port extends to the expected location of the cavoatrial junction. Mild right hemidiaphragm elevation. No airspace consolidation. Normal pulmonary vasculature. No large effusion. Unremarkable hilar, mediastinal and cardiac contours. IMPRESSION: No acute cardiopulmonary findings. Electronically Signed   By: Andreas Newport M.D.   On: 12/28/2016 01:11   Dg Abd Portable 1v  Result Date: 12/28/2016 CLINICAL DATA:  Weakness and hypotension, onset around noon. Hematochezia. EXAM: PORTABLE ABDOMEN - 1 VIEW COMPARISON:  None. FINDINGS: Moderate gaseous distention of the stomach. Scattered air throughout unobstructed small and large bowel. No extraluminal gas is evident on this single AP supine image. IMPRESSION: Moderate gaseous distention of the stomach. Electronically Signed   By: Andreas Newport M.D.   On: 12/28/2016  01:11    Microbiology: Recent Results (from the past 240 hour(s))  Culture, blood (routine x 2)     Status: None (Preliminary result)   Collection Time: 01/12/17 11:12 AM  Result Value Ref Range Status   Specimen Description BLOOD RIGHT HAND  Final   Special Requests   Final    BOTTLES DRAWN AEROBIC ONLY Blood Culture results may not be optimal due to an excessive volume of blood received in culture bottles   Culture NO GROWTH 2 DAYS  Final   Report Status PENDING  Incomplete  Culture, blood (routine x 2)     Status: None (Preliminary result)   Collection Time: 01/12/17 11:12 AM  Result Value Ref Range Status   Specimen Description BLOOD RIGHT ARM  Final   Special  Requests   Final    IN PEDIATRIC BOTTLE Blood Culture results may not be optimal due to an excessive volume of blood received in culture bottles   Culture NO GROWTH 2 DAYS  Final   Report Status PENDING  Incomplete     Labs: Basic Metabolic Panel:  Recent Labs Lab 01/09/17 0118 01/10/17 0438 01/11/17 0956 01/12/17 0430 01/13/17 0412  NA 130* 132* 130* 131* 132*  K 4.6 4.4 4.8 4.2 4.4  CL 95* 95* 93* 94* 93*  CO2 20* 22 22 22 22   GLUCOSE 68 87 74 70 78  BUN 64* 43* 53* 33* 49*  CREATININE 9.39* 7.12* 9.41* 6.71* 8.49*  CALCIUM 7.6* 7.8* 8.2* 7.9* 8.0*  PHOS  --   --  7.7*  --   --    Liver Function Tests:  Recent Labs Lab 01/11/17 0956  ALBUMIN <1.0*   No results for input(s): LIPASE, AMYLASE in the last 168 hours. No results for input(s): AMMONIA in the last 168 hours. CBC:  Recent Labs Lab 01/09/17 0118 01/10/17 0438 01/11/17 0956 01/12/17 0430 01/13/17 0412  WBC 10.4 8.2 12.2* 15.3* 18.1*  HGB 6.5* 8.5* 7.8* 7.4* 7.4*  HCT 19.2* 24.4* 23.0* 22.0* 21.9*  MCV 83.8 83.8 83.9 84.6 83.9  PLT 186 202 156 155 152   Cardiac Enzymes: No results for input(s): CKTOTAL, CKMB, CKMBINDEX, TROPONINI in the last 168 hours. BNP: BNP (last 3 results) No results for input(s): BNP in the last 8760 hours.  ProBNP (last 3 results) No results for input(s): PROBNP in the last 8760 hours.  CBG:  Recent Labs Lab 01/14/17 1135 01/14/17 1744 01/14/17 2207 01/15/17 0759 01/15/17 0834  GLUCAP 111* 97 80 48* 70       Signed:  Alveena Taira MD.  [facility] 01/15/2017, 10:32 AM

## 2017-01-15 NOTE — Progress Notes (Signed)
Patient was discharged to Residential Hospice by MD order; discharged instructions  review and sent to facility  with care notes and prescription; right port a cath on; patient will be transported to facility via [place]. Family at bedside; facility was called and report was given to the nurse who is going to receive the patient.

## 2017-01-17 LAB — CULTURE, BLOOD (ROUTINE X 2)
CULTURE: NO GROWTH
Culture: NO GROWTH

## 2017-01-24 DEATH — deceased

## 2017-02-24 DEATH — deceased

## 2017-05-10 IMAGING — CT CT ABD-PELV W/O CM
2 of 4 series · 16 of 46 positions shown, 18 images · non-contrast
Comparison: Abdomen CT 11/02/2016

CLINICAL DATA: GI bleed.  Is pancreatic cancer.

EXAM:
CT ABDOMEN AND PELVIS WITHOUT CONTRAST
TECHNIQUE: Multidetector CT imaging of the abdomen and pelvis was performed
following the standard protocol without IV contrast.

[Series 3: abd/ pelvis 5.0 i30f 2 · axial · 0.98mm/px · z∈[-897,-407]mm · 13 of 108 slices shown, 15 images]
[im 5/108  soft-tissue]
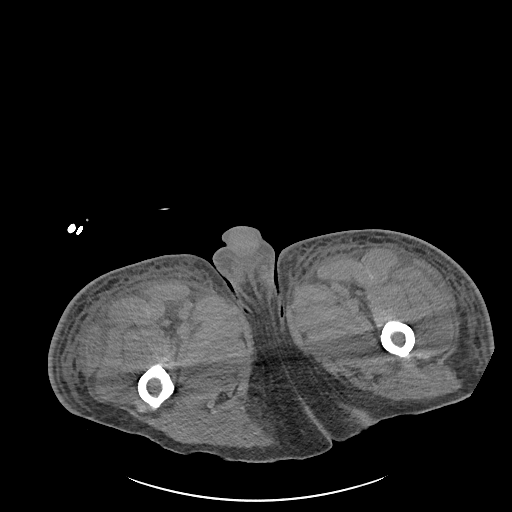
[im 5/108  bone]
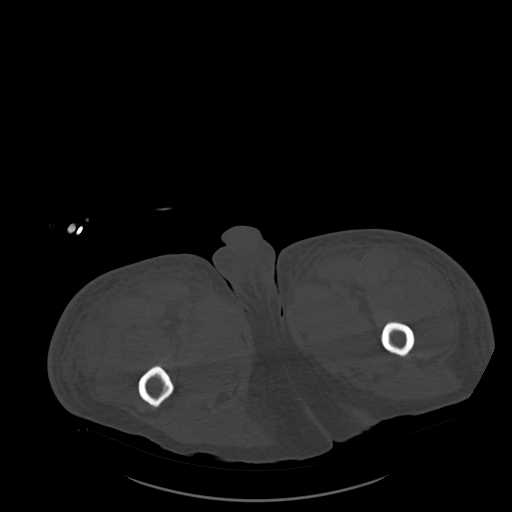
[im 13/108  soft-tissue]
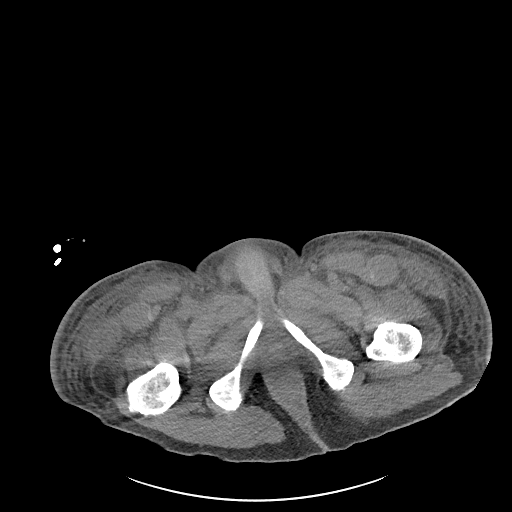
[im 21/108  soft-tissue]
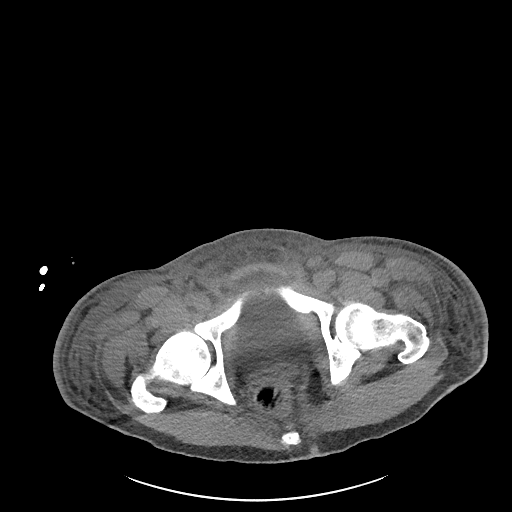
[im 29/108  soft-tissue]
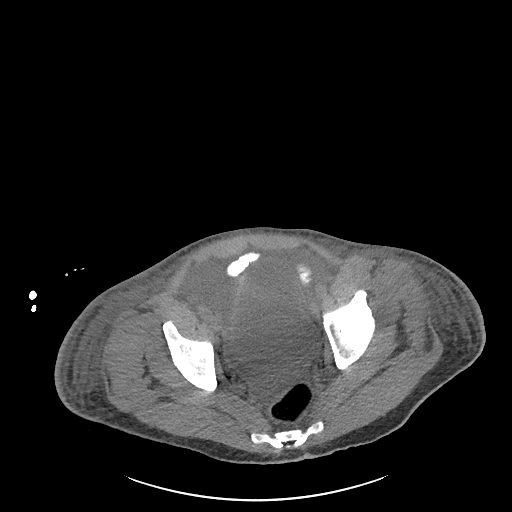
[im 38/108  soft-tissue]
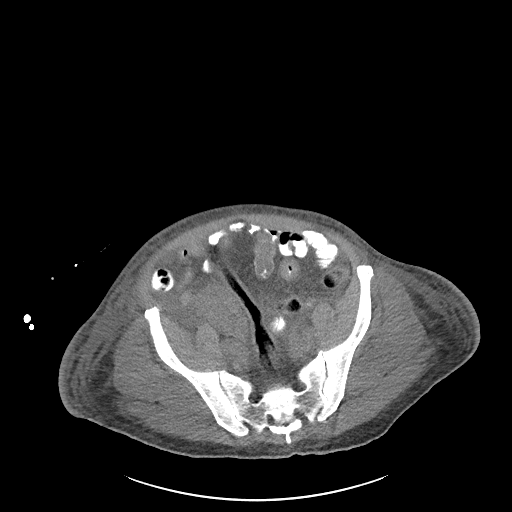
[im 46/108  soft-tissue]
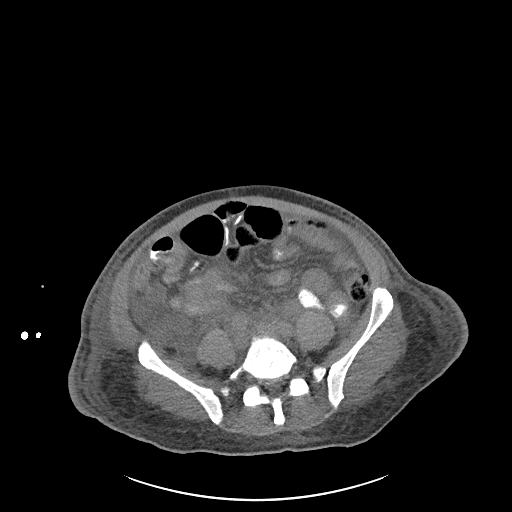
[im 54/108  soft-tissue]
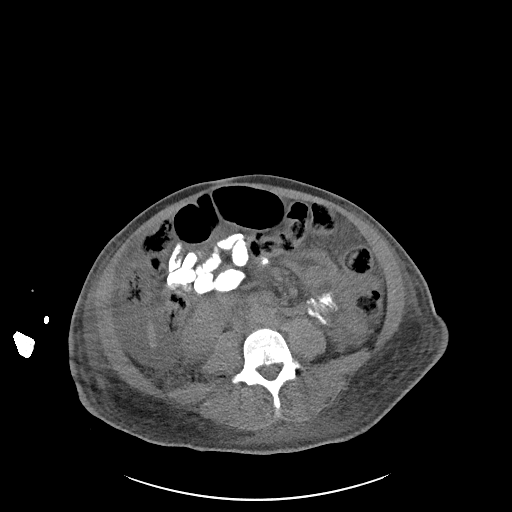
[im 62/108  soft-tissue]
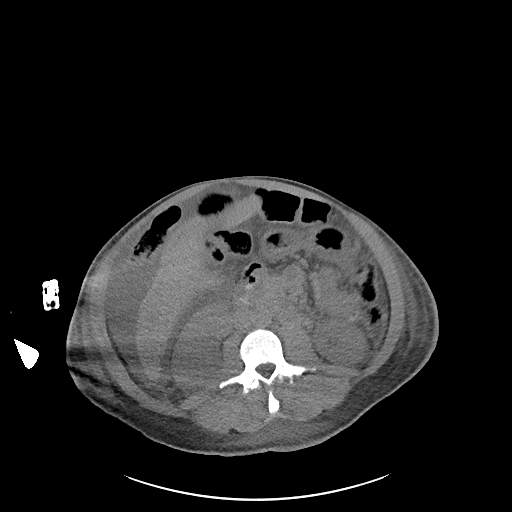
[im 70/108  soft-tissue]
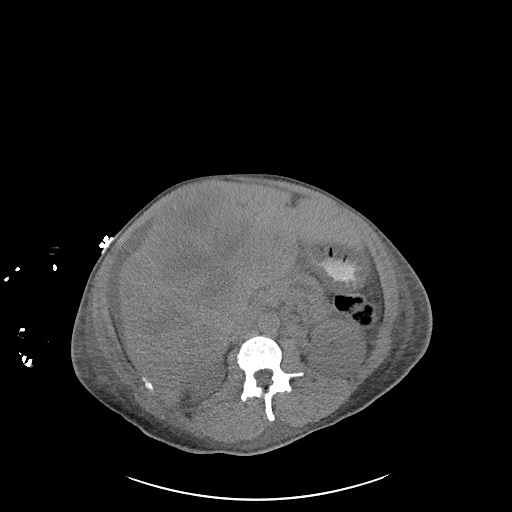
[im 70/108  bone]
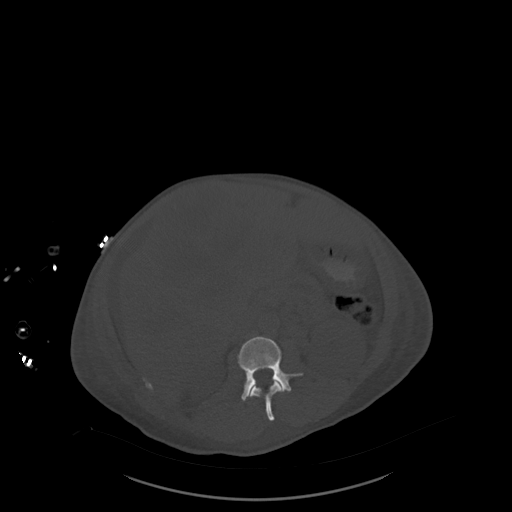
[im 79/108  soft-tissue]
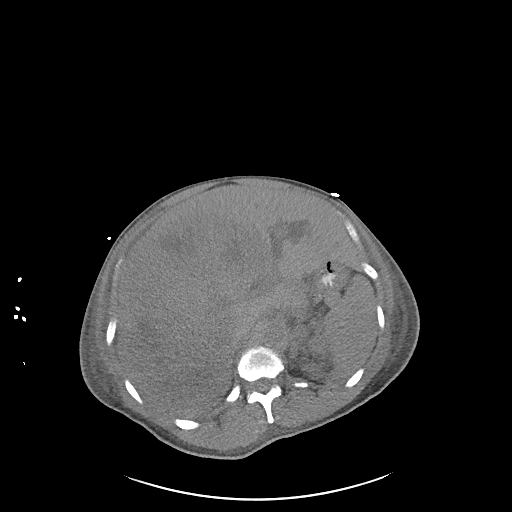
[im 87/108  soft-tissue]
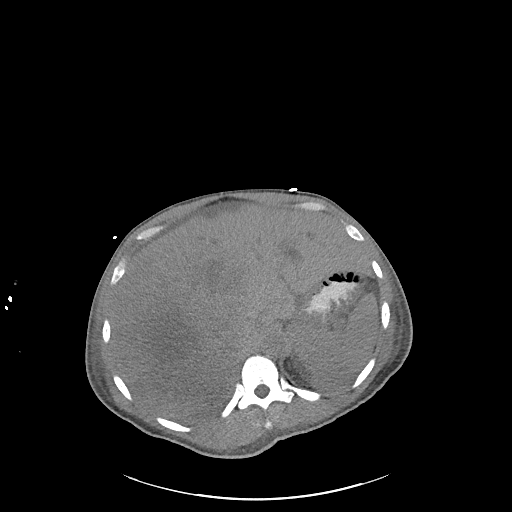
[im 95/108  soft-tissue]
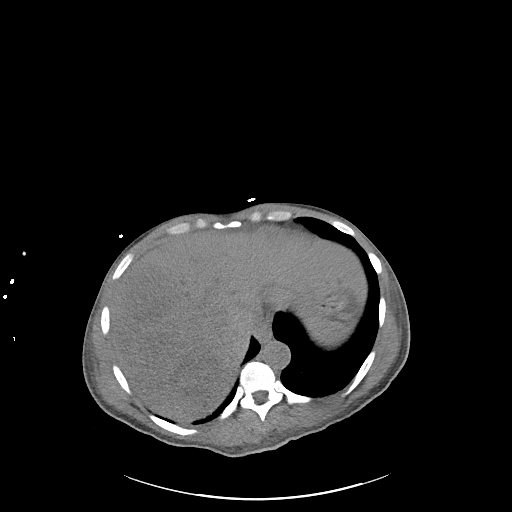
[im 103/108  soft-tissue]
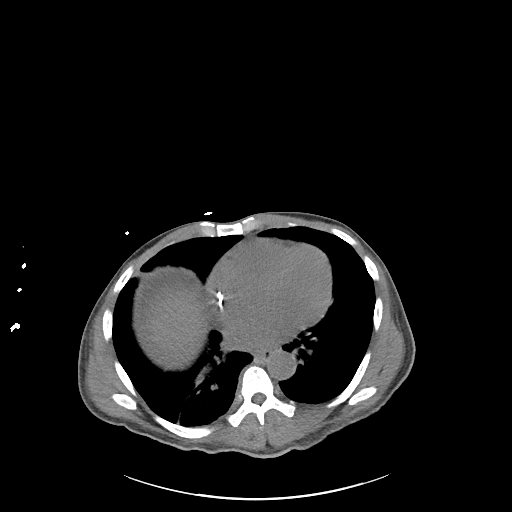

[Series 6: cor st · coronal · 0.95mm/px · 3 of 99 slices shown]
[im 33/99  soft-tissue]
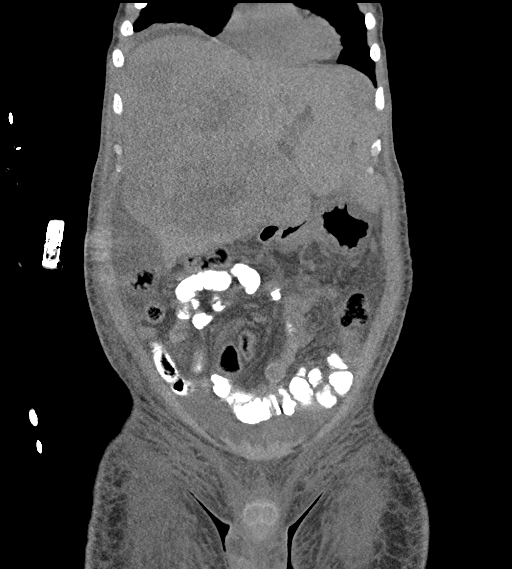
[im 44/99  soft-tissue]
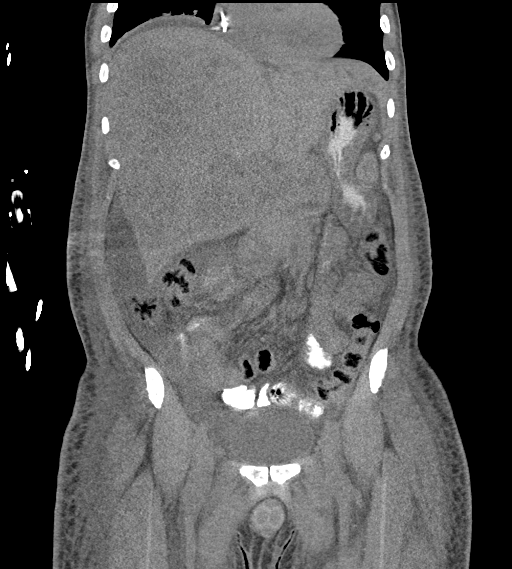
[im 55/99  soft-tissue]
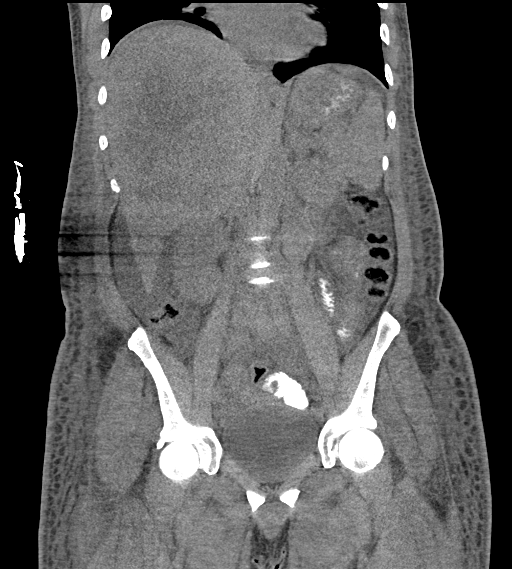

[16 of 46 positions shown; findings below may reference images not displayed]

FINDINGS: Lower chest: Subsegmental atelectasis noted right lower lobe. 5 mm
perifissural nodule seen in the right lower lung on image 1 series
4.

Hepatobiliary: Bulky liver lesions are again noted, poorly defined
given the lack of intravenous contrast material. Some of the lesions
probably measure up to 7-8 cm. Lesions appear confluent in some
areas. Gallbladder not visualized. Insert no biliary

Pancreas: Pancreatic head mass seen previously at 4.0 x 2.9 cm now
measures 3.8 x 3.1 cm

Spleen: No splenomegaly. No focal mass lesion.

Adrenals/Urinary Tract: No adrenal nodule or mass. No hydronephrosis
in either kidney. Bladder moderately distended.

Stomach/Bowel: Stomach is nondistended. No evidence for small bowel
obstruction. No colonic obstruction.

Vascular/Lymphatic: No abdominal aortic aneurysm. No definite
lymphadenopathy in the abdomen or pelvis.

Reproductive: The prostate gland and seminal vesicles have normal
imaging features.

Other: Small to moderate volume intraperitoneal free fluid is
evident.

Musculoskeletal: Diffuse body wall and mesenteric edema is noted.
Bone windows reveal no worrisome lytic or sclerotic osseous lesions.
IMPRESSION: Limited study due to lack of intravenous contrast and cachexia.

Bulky hepatic metastases with no substantial change in size of the
pancreatic head mass.

Small moderate volume ascites with diffuse body wall edema.
